# Patient Record
Sex: Female | Born: 1990 | Race: Black or African American | Hispanic: Yes | Marital: Single | Smoking: Never smoker
Health system: Southern US, Community
[De-identification: ages and names within clinical notes are randomized; demographics above are authoritative.]

## PROBLEM LIST (undated history)

## (undated) ENCOUNTER — Inpatient Hospital Stay (HOSPITAL_COMMUNITY): Payer: Self-pay

## (undated) DIAGNOSIS — T8859XA Other complications of anesthesia, initial encounter: Secondary | ICD-10-CM

## (undated) DIAGNOSIS — Z9889 Other specified postprocedural states: Secondary | ICD-10-CM

## (undated) DIAGNOSIS — Z789 Other specified health status: Secondary | ICD-10-CM

## (undated) DIAGNOSIS — T4145XA Adverse effect of unspecified anesthetic, initial encounter: Secondary | ICD-10-CM

## (undated) DIAGNOSIS — R112 Nausea with vomiting, unspecified: Secondary | ICD-10-CM

## (undated) HISTORY — DX: Adverse effect of unspecified anesthetic, initial encounter: T41.45XA

## (undated) HISTORY — DX: Other specified postprocedural states: Z98.890

## (undated) HISTORY — DX: Other complications of anesthesia, initial encounter: T88.59XA

## (undated) HISTORY — PX: DILATION AND CURETTAGE OF UTERUS: SHX78

## (undated) HISTORY — DX: Other specified health status: Z78.9

## (undated) HISTORY — DX: Nausea with vomiting, unspecified: R11.2

---
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
## 2010-01-13 ENCOUNTER — Emergency Department: Payer: Self-pay | Admitting: Emergency Medicine

## 2011-02-05 ENCOUNTER — Ambulatory Visit: Payer: Self-pay | Admitting: Obstetrics and Gynecology

## 2011-02-05 LAB — CBC WITH DIFFERENTIAL/PLATELET
Basophil #: 0 10*3/uL (ref 0.0–0.1)
Basophil %: 0.3 %
Eosinophil %: 1.3 %
HCT: 35.7 % (ref 35.0–47.0)
HGB: 12.2 g/dL (ref 12.0–16.0)
Lymphocyte #: 1.3 10*3/uL (ref 1.0–3.6)
Lymphocyte %: 18.5 %
MCHC: 34.2 g/dL (ref 32.0–36.0)
MCV: 89 fL (ref 80–100)
Monocyte #: 0.5 10*3/uL (ref 0.0–0.7)
Monocyte %: 6.8 %
Neutrophil #: 5.2 10*3/uL (ref 1.4–6.5)
RBC: 3.99 10*6/uL (ref 3.80–5.20)
WBC: 7.1 10*3/uL (ref 3.6–11.0)

## 2011-02-06 ENCOUNTER — Inpatient Hospital Stay: Payer: Self-pay | Admitting: Obstetrics and Gynecology

## 2011-02-07 LAB — HEMATOCRIT: HCT: 33.8 % — ABNORMAL LOW (ref 35.0–47.0)

## 2014-04-03 ENCOUNTER — Ambulatory Visit (INDEPENDENT_AMBULATORY_CARE_PROVIDER_SITE_OTHER): Payer: BLUE CROSS/BLUE SHIELD | Admitting: Emergency Medicine

## 2014-04-03 ENCOUNTER — Ambulatory Visit (HOSPITAL_COMMUNITY)
Admission: RE | Admit: 2014-04-03 | Discharge: 2014-04-03 | Disposition: A | Discharge status: Home / Self Care | Payer: BLUE CROSS/BLUE SHIELD | Source: Ambulatory Visit | Attending: Emergency Medicine | Admitting: Emergency Medicine

## 2014-04-03 VITALS — BP 104/60 | HR 61 | Temp 98.1°F | Resp 16 | Ht 62.0 in | Wt 134.2 lb

## 2014-04-03 DIAGNOSIS — R110 Nausea: Secondary | ICD-10-CM | POA: Insufficient documentation

## 2014-04-03 DIAGNOSIS — R519 Headache, unspecified: Secondary | ICD-10-CM

## 2014-04-03 DIAGNOSIS — R51 Headache: Secondary | ICD-10-CM | POA: Diagnosis present

## 2014-04-03 DIAGNOSIS — R42 Dizziness and giddiness: Secondary | ICD-10-CM | POA: Diagnosis not present

## 2014-04-03 MED ORDER — BUTALBITAL-APAP-CAFFEINE 50-325-40 MG PO TABS: 1.0000 | ORAL_TABLET | Freq: Four times a day (QID) | ORAL | Status: DC | PRN

## 2014-04-03 NOTE — Progress Notes (Signed)
[facility] [address], [place] [address] [phone]  Date:  04/03/2014   Name:  Jenny Randolph   DOB:  12/04/1990   MRN:  [id number]  PCP:  No PCP Per Patient    Chief Complaint: Migraine and Nausea   History of Present Illness:  Jenny Randolph is a 24 y.o. very pleasant female patient who presents with the following:  Ill with headache on right side for two weeks. Says she has NO history of migraine or sever headaches No antecedent illness or head injury No fever or chills Says she awakens with headache and goes to sleep with headaches.  Goes away for two hours with ibuprofen Says in pain free interval she is dizzy and has blurred vision in right eye. No slurred speech, difficulty expressing thoughts.  No weakness.   No difficulty walking or with balance No improvement with over the counter medications or other home remedies.  Denies other complaint or health concern today.  Works as a Child psychotherapistwaitress .  There are no active problems to display for this patient.   History reviewed. No pertinent past medical history.  Past Surgical History  Procedure Laterality Date  . Cesarean section      History  Substance Use Topics  . Smoking status: Never Smoker   . Smokeless tobacco: Not on file  . Alcohol Use: Not on file    Family History  Problem Relation Age of Onset  . Diabetes Mother   . Stroke Father   . Diabetes Maternal Grandmother   . Hyperlipidemia Maternal Grandmother   . Diabetes Maternal Grandfather     Allergies  Allergen Reactions  . Penicillins Hives    Medication list has been reviewed and updated.  No current outpatient prescriptions on file prior to visit.   No current facility-administered medications on file prior to visit.    Review of Systems:  As per HPI, otherwise negative.    Physical Examination: Filed Vitals:   04/03/14 1023  BP: 104/60  Pulse: 61  Temp: 98.1 F (36.7 C)  Resp: 16   Filed  Vitals:   04/03/14 1023  Height: 5\' 2"  (1.575 m)  Weight: 134 lb 3.2 oz (60.873 kg)   Body mass index is 24.54 kg/(m^2). Ideal Body Weight: Weight in (lb) to have BMI = 25: 136.4  GEN: WDWN, NAD, Non-toxic, A & O x 3 HEENT: Atraumatic, Normocephalic. Neck supple. No masses, No LAD. Ears and Nose: No external deformity. CV: RRR, No M/G/R. No JVD. No thrill. No extra heart sounds. PULM: CTA B, no wheezes, crackles, rhonchi. No retractions. No resp. distress. No accessory muscle use. ABD: S, NT, ND, +BS. No rebound. No HSM. EXTR: No c/c/e NEURO Normal gait.   Romberg, tandem gait, toe and heel walking intact PRRERLA EOMI CN 2-12 intact.  Motor intact PSYCH: Normally interactive. Conversant. Not depressed or anxious appearing.  Calm demeanor.    Assessment and Plan: Worst headache of life MR  Signed,  Phillips OdorJeffery Delon Revelo, MD

## 2014-04-03 NOTE — Patient Instructions (Signed)

## 2014-04-29 ENCOUNTER — Emergency Department (HOSPITAL_COMMUNITY)
Admission: EM | Admit: 2014-04-29 | Discharge: 2014-04-30 | Disposition: A | Discharge status: Home / Self Care | Payer: BLUE CROSS/BLUE SHIELD | Attending: Emergency Medicine | Admitting: Emergency Medicine

## 2014-04-29 ENCOUNTER — Encounter (HOSPITAL_COMMUNITY): Payer: Self-pay | Admitting: Emergency Medicine

## 2014-04-29 DIAGNOSIS — J111 Influenza due to unidentified influenza virus with other respiratory manifestations: Secondary | ICD-10-CM | POA: Insufficient documentation

## 2014-04-29 DIAGNOSIS — G8929 Other chronic pain: Secondary | ICD-10-CM | POA: Diagnosis not present

## 2014-04-29 DIAGNOSIS — Z880 Allergy status to penicillin: Secondary | ICD-10-CM | POA: Diagnosis not present

## 2014-04-29 DIAGNOSIS — H9209 Otalgia, unspecified ear: Secondary | ICD-10-CM | POA: Diagnosis not present

## 2014-04-29 DIAGNOSIS — R509 Fever, unspecified: Secondary | ICD-10-CM | POA: Diagnosis present

## 2014-04-29 MED ORDER — ACETAMINOPHEN 325 MG PO TABS
650.0000 mg | ORAL_TABLET | Freq: Four times a day (QID) | ORAL | Status: DC | PRN
  Administered 2014-04-29: 650 mg via ORAL
  Filled 2014-04-29: qty 2

## 2014-04-29 MED ORDER — OSELTAMIVIR PHOSPHATE 75 MG PO CAPS
75.0000 mg | ORAL_CAPSULE | ORAL | Status: AC
  Administered 2014-04-29: 75 mg via ORAL
  Filled 2014-04-29: qty 1

## 2014-04-29 NOTE — ED Provider Notes (Signed)
CSN: [id number]     Arrival date & time 04/29/14  2228 History  This chart was scribed for non-physician practitioner Earley Favor, PA-C working with Tilden Fossa, MD by Annye Asa, ED Scribe. This patient was seen in room WTR8/WTR8 and the patient's care was started at 11:12 PM.   Chief Complaint  Patient presents with  . Fever  . Sore Throat  . Otalgia   Patient is a 24 y.o. female presenting with fever, pharyngitis, and ear pain. The history is provided by the patient. No language interpreter was used.  Fever Associated symptoms: cough, ear pain, myalgias and sore throat   Sore Throat  Otalgia Associated symptoms: cough, fever and sore throat     HPI Comments: Jenny Randolph is a 24 y.o. female who presents to the Emergency Department complaining of 2 days of fever, generalized myalgias, sore throat, bilateral otalgia, and cough. Patient reports she has taken some "headache medication" she was prescribed previously for chronic headaches; she has been trying to "sweat out" the fever. No other treatments or medications tried PTA.   No PCP at this time.   History reviewed. No pertinent past medical history. Past Surgical History  Procedure Laterality Date  . Cesarean section     Family History  Problem Relation Age of Onset  . Diabetes Mother   . Stroke Father   . Diabetes Maternal Grandmother   . Hyperlipidemia Maternal Grandmother   . Diabetes Maternal Grandfather    History  Substance Use Topics  . Smoking status: Never Smoker   . Smokeless tobacco: Not on file  . Alcohol Use: No   OB History    No data available     Review of Systems  Constitutional: Positive for fever.  HENT: Positive for ear pain and sore throat.   Respiratory: Positive for cough.   Musculoskeletal: Positive for myalgias.   Allergies  Penicillins  Home Medications   Prior to Admission medications   Medication Sig Start Date End Date Taking? Authorizing Provider   butalbital-acetaminophen-caffeine (FIORICET) 50-325-40 MG per tablet Take 1-2 tablets by mouth every 6 (six) hours as needed for headache. 04/03/14 04/03/15 Yes Carmelina Dane, MD  ibuprofen (ADVIL,MOTRIN) 800 MG tablet Take 800 mg by mouth every 8 (eight) hours as needed.    Historical Provider, MD  oseltamivir (TAMIFLU) 75 MG capsule Take 1 capsule (75 mg total) by mouth 2 (two) times daily. 04/30/14   Earley Favor, NP   BP 122/82 mmHg  Pulse 108  Temp(Src) 99.2 F (37.3 C) (Oral)  Resp 18  Ht  (1.549 m)  Wt 134 lb (60.782 kg)  BMI 25.33 kg/m2  SpO2 96%  LMP 03/19/2014 Physical Exam  Constitutional: She is oriented to person, place, and time. She appears well-developed and well-nourished.  HENT:  Head: Normocephalic and atraumatic.  Right Ear: External ear normal.  Left Ear: External ear normal.  Mouth/Throat: Oropharynx is clear and moist. No oropharyngeal exudate.  Neck: No tracheal deviation present.  Cardiovascular: Normal rate, regular rhythm and normal heart sounds.   Pulmonary/Chest: Effort normal and breath sounds normal. No respiratory distress. She has no wheezes. She has no rales.  Neurological: She is alert and oriented to person, place, and time.  Skin: Skin is warm and dry.  Psychiatric: She has a normal mood and affect. Her behavior is normal.  Nursing note and vitals reviewed.   ED Course  Procedures   DIAGNOSTIC STUDIES: Oxygen Saturation is 96% on RA, adequate by my  interpretation.    COORDINATION OF CARE: 11:14 PM Discussed treatment plan with pt at bedside and pt agreed to plan.   Labs Review Labs Reviewed - No data to display  Imaging Review No results found.   EKG Interpretation None      MDM   Final diagnoses:  Influenza    I personally performed the services described in this documentation, which was scribed in my presence. The recorded information has been reviewed and is accurate.     Earley FavorGail [place], NP 04/30/14  0018  Tilden FossaElizabeth Rees, MD 04/30/14 [phone]

## 2014-04-29 NOTE — ED Notes (Signed)
Pt reports yesterday started having fever, sore throat, and bilateral otalgia. Denies taking any medications for fever, states she did take some "headache medication" that she was prescribed for chronic headaches.

## 2014-04-30 MED ORDER — OSELTAMIVIR PHOSPHATE 75 MG PO CAPS: 75.0000 mg | ORAL_CAPSULE | Freq: Two times a day (BID) | ORAL | Status: DC

## 2014-04-30 NOTE — Discharge Instructions (Signed)
Cough, Adult  A cough is a reflex. It helps you clear your throat and airways. A cough can help heal your body. A cough can last 2 or 3 weeks (acute) or may last more than 8 weeks (chronic). Some common causes of a cough can include an infection, allergy, or a cold. HOME CARE  Only take medicine as told by your doctor.  If given, take your medicines (antibiotics) as told. Finish them even if you start to feel better.  Use a cold steam vaporizer or humidifier in your home. This can help loosen thick spit (secretions).  Sleep so you are almost sitting up (semi-upright). Use pillows to do this. This helps reduce coughing.  Rest as needed.  Stop smoking if you smoke. GET HELP RIGHT AWAY IF:  You have yellowish-white fluid (pus) in your thick spit.  Your cough gets worse.  Your medicine does not reduce coughing, and you are losing sleep.  You cough up blood.  You have trouble breathing.  Your pain gets worse and medicine does not help.  You have a fever. MAKE SURE YOU:   Understand these instructions.  Will watch your condition.  Will get help right away if you are not doing well or get worse. Document Released: 09/27/2010 Document Revised: 05/31/2013 Document Reviewed: 09/27/2010 [facility] Patient Information 2015 [employer] [place]. This information is not intended to replace advice given to you by your health care provider. Make sure you discuss any questions you have with your health care provider. You can safely take over-the-counter Delsym for any cough.  He can take Tylenol and ibuprofen alternating doses every 4 hours for fever and body aches.  Please try to get rest, drink plenty of fluids

## 2014-04-30 NOTE — ED Notes (Signed)
Mom reports pt started to have a fever last night and states that pt was c/o ear pain.

## 2014-05-22 NOTE — Op Note (Signed)
PATIENT NAME:  Jenny Randolph, [name] MR#:  [id number] DATE OF BIRTH:  02-10-1990  DATE OF PROCEDURE:  02/06/2011  PREOPERATIVE DIAGNOSES:  1. History of prior Cesarean section, desires repeat. 2. Intrauterine pregnancy at 39 weeks, 3 days.   POSTOPERATIVE DIAGNOSES: 1. History of prior Cesarean section, desires repeat. 2. Intrauterine pregnancy at 39 weeks, 3 days.   OPERATION PERFORMED:  1. Repeat low transverse Cesarean section. 2. Scar revision.   ANESTHESIA USED: Spinal.   PRIMARY SURGEON: Florina OuAndreas M. Bonney AidStaebler, MD   ASSISTANT: Shella Maximarcia Putnam, Certified Nurse Midwife     ESTIMATED BLOOD LOSS: 500 mL.  OPERATIVE FLUIDS: 1300 mL of Crystalloid; 250 mL of clear urine.   COMPLICATIONS: None.   DRAINS OR TUBES: Foley to gravity drainage and ON-Q catheter system.   INTRAOPERATIVE FINDINGS: Normal tubes, ovaries, and uterus, liveborn female infant weighing 2680 grams, Apgars 9 and 9.   COMPLICATIONS: None.   SPECIMENS: None.   PATIENT CONDITION FOLLOWING PROCEDURE: Stable.   PROCEDURE IN DETAIL: Risks, benefits, and alternatives of the procedure were discussed with the patient prior to proceeding to the operating room. The patient was placed under spinal anesthesia and she was prepped and draped in the supine position in the usual sterile fashion. An elliptical incision was made around the patient's pre-existing to remove keloid that had formed from her previous Cesarean section incision. Incision was carried down sharply to the level of the rectus fascia using the knife. The fascia was excised with the knife and then the fascial incision was extended using Mayo scissors. The superior edges of the rectus fascia were grasped with two Kocher clamps and the underlying rectus was bluntly dissected off the fascial sheath. The median raphe was then incised using Mayo scissors. The inferior rectus fascia was likewise separated off the rectus muscle. The midline was identified and separated using  a hemostat. Upon separating the midline rectus muscle with a hemostat, the peritoneum was entered. The peritoneal incision was then extended using Metzenbaum scissors and manual traction. A bladder blade was placed. Bladder flap was then created and the bladder blade was replaced. Hysterotomy incision was made and the uterus was entered bluntly using the operator's finger. The infant was noted to be in the OA position. The vertex was grasped, flexed, brought to hysterotomy incision and infant was delivered atraumatically. The infant's cord was cut and clamped and infant was suctioned and passed to the awaiting pediatricians. Cord gas was obtained at this time. The placenta was delivered using manual extraction. The uterus was exteriorized and wiped clean of clots and debris. The edges of the hysterotomy incision were tagged with Allis clamps and the hysterotomy incision was then repaired in a two layered closure with the first closure being 0 Vicryl in a running locked fashion and the second layer a 0 Vicryl horizontal imbricating. Following closure of the hysterotomy incision, the abdomen was irrigated and the uterus was replaced into the abdomen. The hysterotomy incision was inspected one last time and noted to be hemostatic. The peritoneum was then closed using a 2-0 Vicryl in a running fashion. Next, an ON-Q catheter system was placed by tenting the superior border of the rectus fascia up and then placing the trocars to guide the catheter subfascially. Each catheter was flushed with 5 mL of 0.5% Sensorcaine. The rectus fascia was then closed using a #1 loop PDS in a running fashion. The subcutaneous tissue was irrigated and hemostasis was achieved using the Bovie. The skin was closed using a 4-0  Monocryl in a subcuticular fashion.   ____________________________ Florina Ou. Bonney Aid, MD ams:drc D: 02/08/2011 18:57:04 ET T: 02/09/2011 09:55:38 ET JOB#: [id number]  cc: Florina Ou. Bonney Aid, MD,  <Dictator> Lorrene Reid MD ELECTRONICALLY SIGNED 02/20/2011 9:03

## 2014-06-21 ENCOUNTER — Other Ambulatory Visit: Payer: Self-pay | Admitting: Certified Nurse Midwife

## 2014-06-21 ENCOUNTER — Encounter: Payer: Self-pay | Admitting: Certified Nurse Midwife

## 2014-06-21 ENCOUNTER — Ambulatory Visit (INDEPENDENT_AMBULATORY_CARE_PROVIDER_SITE_OTHER): Payer: BLUE CROSS/BLUE SHIELD | Admitting: Certified Nurse Midwife

## 2014-06-21 VITALS — BP 107/76 | HR 81 | Temp 98.3°F | Ht 61.0 in | Wt 132.0 lb

## 2014-06-21 DIAGNOSIS — N63 Unspecified lump in unspecified breast: Secondary | ICD-10-CM

## 2014-06-21 DIAGNOSIS — N6310 Unspecified lump in the right breast, unspecified quadrant: Secondary | ICD-10-CM

## 2014-06-21 DIAGNOSIS — Z01419 Encounter for gynecological examination (general) (routine) without abnormal findings: Secondary | ICD-10-CM

## 2014-06-21 DIAGNOSIS — Z3009 Encounter for other general counseling and advice on contraception: Secondary | ICD-10-CM

## 2014-06-21 DIAGNOSIS — Z309 Encounter for contraceptive management, unspecified: Secondary | ICD-10-CM | POA: Diagnosis not present

## 2014-06-21 DIAGNOSIS — N6320 Unspecified lump in the left breast, unspecified quadrant: Secondary | ICD-10-CM

## 2014-06-21 DIAGNOSIS — Z3201 Encounter for pregnancy test, result positive: Secondary | ICD-10-CM

## 2014-06-21 LAB — CBC
HEMATOCRIT: 41.6 % (ref 36.0–46.0)
Hemoglobin: 13.6 g/dL (ref 12.0–15.0)
MCH: 29.2 pg (ref 26.0–34.0)
MCHC: 32.7 g/dL (ref 30.0–36.0)
MCV: 89.3 fL (ref 78.0–100.0)
MPV: 10.7 fL (ref 8.6–12.4)
PLATELETS: 229 10*3/uL (ref 150–400)
RBC: 4.66 MIL/uL (ref 3.87–5.11)
RDW: 13.9 % (ref 11.5–15.5)
WBC: 4.3 10*3/uL (ref 4.0–10.5)

## 2014-06-21 MED ORDER — NORELGESTROMIN-ETH ESTRADIOL 150-35 MCG/24HR TD PTWK: 1.0000 | MEDICATED_PATCH | TRANSDERMAL | Status: DC

## 2014-06-21 NOTE — Addendum Note (Signed)
Addended by: Marya LandryFOSTER, Gordon Vandunk D on: 06/21/2014 02:39 PM   Modules accepted: Orders

## 2014-06-21 NOTE — Progress Notes (Signed)
Patient ID: Jenny Randolph, female   DOB: 10/24/1990, 24 y.o.   MRN: [id number]    Subjective:      Jenny Randolph is a 24 y.o. female here for a routine exam.  Current complaints: none, desires STD screening.  Has tried Mirena, Depo and Paediatric nurse, desires contraception. Is sexually active.  Mirena, had no appetite, Depo gained weight and did not care for the feel of Nuva Ring.  Father has had 2 CVAs.  In school and working.  Reports positive at home pregnancy tests, in office test was neg, f/u with blood testing. Declines dermatology referral at the present time.    Personal health questionnaire:  Is patient Ashkenazi Jewish, have a family history of breast and/or ovarian cancer: yes Is there a family history of uterine cancer diagnosed at age < 73, gastrointestinal cancer, urinary tract cancer, family member who is a Personnel officer syndrome-associated carrier: yes Is the patient overweight and hypertensive, family history of diabetes, personal history of gestational diabetes, preeclampsia or PCOS: yes Is patient over 11, have PCOS,  family history of premature CHD under age 35, diabetes, smoke, have hypertension or peripheral artery disease:  yes At any time, has a partner hit, kicked or otherwise hurt or frightened you?: no Over the past 2 weeks, have you felt down, depressed or hopeless?: no Over the past 2 weeks, have you felt little interest or pleasure in doing things?:no   Gynecologic History Patient's last menstrual period was 05/29/2014. Contraception: none Last Pap: unknown. Results were: normal according to the patient  Last mammogram: N/A. Never had any breast issues  Obstetric History OB History  Gravida Para Term Preterm AB SAB TAB Ectopic Multiple Living  # Outcome Date GA Lbr Len/2nd Weight Sex Delivery Anes PTL Lv  3 Gravida 2013    F CS-LTranv Spinal  Y  2 Gravida 2010    F CS-LTranv EPI  Y  1 SAB 2009             Comments: Dilation and curretage       History reviewed. No pertinent past medical history.  Past Surgical History  Procedure Laterality Date  . Cesarean section       Current outpatient prescriptions:  .  butalbital-acetaminophen-caffeine (FIORICET) 50-325-40 MG per tablet, Take 1-2 tablets by mouth every 6 (six) hours as needed for headache. (Patient not taking: Reported on 06/21/2014), Disp: 40 tablet, Rfl: 1 .  norelgestromin-ethinyl estradiol (ORTHO EVRA) 150-35 MCG/24HR transdermal patch, Place 1 patch onto the skin once a week., Disp: 3 patch, Rfl: 12 Allergies  Allergen Reactions  . Penicillins Hives    History  Substance Use Topics  . Smoking status: Never Smoker   . Smokeless tobacco: Not on file  . Alcohol Use: No    Family History  Problem Relation Age of Onset  . Diabetes Mother   . Stroke Father   . Diabetes Maternal Grandmother   . Hyperlipidemia Maternal Grandmother   . Diabetes Maternal Grandfather       Review of Systems  Constitutional: negative for fatigue and weight loss Respiratory: negative for cough and wheezing Cardiovascular: negative for chest pain, fatigue and palpitations Gastrointestinal: negative for abdominal pain and change in bowel habits Musculoskeletal:negative for myalgias Neurological: negative for gait problems and tremors Behavioral/Psych: negative for abusive relationship, depression Endocrine: negative for temperature intolerance   Genitourinary:negative for abnormal menstrual periods, genital lesions, hot flashes, sexual  problems and vaginal discharge Integument/breast: negative for breast lump, breast tenderness, nipple discharge and skin lesion(s).  Does have recent acne break out.     Objective:       BP 107/76 mmHg  Pulse 81  Temp(Src) 98.3 F (36.8 C)  Ht 5\' 1"  (1.549 m)  Wt 132 lb (59.875 kg)  BMI 24.95 kg/m2  LMP 05/29/2014 General:   alert  Skin:   no rash or abnormalities  Lungs:   clear to auscultation bilaterally  Heart:   regular rate and  rhythm, S1, S2 normal, no murmur, click, rub or gallop  Breasts:   normal skin and no nipple changes or axillary nodes.  2  suspicious masses: one on right side under nipple towards sternum above breast bone the other on the left side towards sternum lateral to the nipple about 1 cm in size.   Abdomen:  normal findings: no organomegaly, soft, non-tender and no hernia  Pelvis:  External genitalia: normal general appearance Urinary system: urethral meatus normal and bladder without fullness, nontender Vaginal: normal without tenderness, induration or masses Cervix: normal appearance Adnexa: normal bimanual exam Uterus: anteverted and non-tender, normal size   Lab Review Urine pregnancy test Labs reviewed yes Radiologic studies reviewed no  50% of 30 min visit spent on counseling and coordination of care.   Assessment:    Healthy female exam.   Suspicious breast lesions ?at home positive pregnancy test Contraceptive counseling STD screening   Plan:    Education reviewed: calcium supplements, depression evaluation, low fat, low cholesterol diet, safe sex/STD prevention, self breast exams, skin cancer screening and weight bearing exercise. Contraception: Ortho-Evra patches weekly. Follow up in: 1 year.   Meds ordered this encounter  Medications  . norelgestromin-ethinyl estradiol (ORTHO EVRA) 150-35 MCG/24HR transdermal patch    Sig: Place 1 patch onto the skin once a week.    Dispense:  3 patch    Refill:  12   Orders Placed This Encounter  Procedures  . US BREAST LTD UNI LEFT INC AXILLA    06/21/14-co sign requested/gmd suspicious breast masses bilateral/POSITION IS 6 O'CLOCK/NO HX OF BREAST CA/NO IMPLANTS PF NONE/NO NEEDS/GMD/PT/[employer]/[facility] [phone] [id number]/GMD    Standing Status: Future     Number of Occurrences:      Standing Expiration Date: 08/21/2015    Order Specific Question:  Reason for Exam (SYMPTOM  OR DIAGNOSIS REQUIRED)    Answer:  suspicious breast masses  bilateral/POSITION IS 6 O'CLOCK    Order Specific Question:  Preferred imaging location?    [facility]  . HIV antibody (with reflex)  . Hepatitis B surface antigen  . RPR  . Hepatitis C antibody  . TSH  . CBC  . Prolactin  . Testosterone, Free, Total, SHBG  . 17-Hydroxyprogesterone  . Progesterone  . hCG, quantitative, pregnancy  . POCT urine pregnancy

## 2014-06-22 ENCOUNTER — Ambulatory Visit
Admission: RE | Admit: 2014-06-22 | Discharge: 2014-06-22 | Disposition: A | Discharge status: Home / Self Care | Payer: BLUE CROSS/BLUE SHIELD | Source: Ambulatory Visit | Attending: Certified Nurse Midwife | Admitting: Certified Nurse Midwife

## 2014-06-22 ENCOUNTER — Telehealth: Payer: Self-pay | Admitting: *Deleted

## 2014-06-22 DIAGNOSIS — N63 Unspecified lump in unspecified breast: Secondary | ICD-10-CM

## 2014-06-22 LAB — RPR

## 2014-06-22 LAB — PAP IG W/ RFLX HPV ASCU

## 2014-06-22 LAB — PROGESTERONE: Progesterone: 9.9 ng/mL

## 2014-06-22 LAB — HIV ANTIBODY (ROUTINE TESTING W REFLEX): HIV 1&2 Ab, 4th Generation: NONREACTIVE

## 2014-06-22 LAB — TESTOSTERONE, FREE, TOTAL, SHBG
Sex Hormone Binding: 57 nmol/L (ref 17–124)
Testosterone, Free: 4.1 pg/mL (ref 0.6–6.8)
Testosterone-% Free: 1.3 % (ref 0.4–2.4)
Testosterone: 33 ng/dL (ref 10–70)

## 2014-06-22 LAB — HCG, QUANTITATIVE, PREGNANCY

## 2014-06-22 LAB — HEPATITIS B SURFACE ANTIGEN: HEP B S AG: NEGATIVE

## 2014-06-22 LAB — PROLACTIN: PROLACTIN: 12.3 ng/mL

## 2014-06-22 LAB — HEPATITIS C ANTIBODY: HCV Ab: NEGATIVE

## 2014-06-22 LAB — TSH: TSH: 1.309 u[IU]/mL (ref 0.350–4.500)

## 2014-06-22 NOTE — Telephone Encounter (Signed)
Pt called to office for lab results.   Pt made aware of results including pregnancy results.  Pt made aware that quant is normal level, non pregnancy. Pt has no other concerns.

## 2014-06-23 LAB — SURESWAB, VAGINOSIS/VAGINITIS PLUS
Atopobium vaginae: NOT DETECTED Log (cells/mL)
C. ALBICANS, DNA: NOT DETECTED
C. GLABRATA, DNA: NOT DETECTED
C. PARAPSILOSIS, DNA: NOT DETECTED
C. TRACHOMATIS RNA, TMA: NOT DETECTED
C. tropicalis, DNA: NOT DETECTED
LACTOBACILLUS SPECIES: 7.6 Log (cells/mL)
MEGASPHAERA SPECIES: NOT DETECTED Log (cells/mL)
N. gonorrhoeae RNA, TMA: NOT DETECTED
T. VAGINALIS RNA, QL TMA: NOT DETECTED

## 2014-06-24 LAB — 17-HYDROXYPROGESTERONE: 17-OH-Progesterone, LC/MS/MS: 219 ng/dL

## 2014-08-08 ENCOUNTER — Encounter: Payer: Self-pay | Admitting: Obstetrics

## 2014-08-08 ENCOUNTER — Ambulatory Visit (INDEPENDENT_AMBULATORY_CARE_PROVIDER_SITE_OTHER): Payer: BLUE CROSS/BLUE SHIELD | Admitting: Obstetrics

## 2014-08-08 VITALS — BP 104/74 | HR 87 | Temp 98.5°F | Ht 61.0 in | Wt 130.0 lb

## 2014-08-08 DIAGNOSIS — N898 Other specified noninflammatory disorders of vagina: Secondary | ICD-10-CM | POA: Diagnosis not present

## 2014-08-08 DIAGNOSIS — R399 Unspecified symptoms and signs involving the genitourinary system: Secondary | ICD-10-CM

## 2014-08-08 LAB — POCT URINALYSIS DIPSTICK
BILIRUBIN UA: NEGATIVE
Glucose, UA: NEGATIVE
Ketones, UA: NEGATIVE
Nitrite, UA: NEGATIVE
Spec Grav, UA: 1.02
UROBILINOGEN UA: NEGATIVE
pH, UA: 5

## 2014-08-08 MED ORDER — NITROFURANTOIN MONOHYD MACRO 100 MG PO CAPS: 100.0000 mg | ORAL_CAPSULE | Freq: Two times a day (BID) | ORAL | Status: DC

## 2014-08-08 NOTE — Progress Notes (Signed)
Patient ID: Jenny Randolph, female   DOB: 11/23/1990, 24 y.o.   MRN: [id number]  Chief Complaint  Patient presents with  . Urinary Tract Infection    HPI Jenny Randolph is a 24 y.o. female.  Burning and pain with urination.  Vaginal discharge but no odor or itching.  Denies fever / chills, N/V or diarrhea.  HPI  History reviewed. No pertinent past medical history.  Past Surgical History  Procedure Laterality Date  . Cesarean section      Family History  Problem Relation Age of Onset  . Diabetes Mother   . Stroke Father   . Diabetes Maternal Grandmother   . Hyperlipidemia Maternal Grandmother   . Diabetes Maternal Grandfather     Social History History  Substance Use Topics  . Smoking status: Never Smoker   . Smokeless tobacco: Not on file  . Alcohol Use: No    Allergies  Allergen Reactions  . Penicillins Hives    Current Outpatient Prescriptions  Medication Sig Dispense Refill  . norelgestromin-ethinyl estradiol (ORTHO EVRA) 150-35 MCG/24HR transdermal patch Place 1 patch onto the skin once a week. 3 patch 12  . butalbital-acetaminophen-caffeine (FIORICET) 50-325-40 MG per tablet Take 1-2 tablets by mouth every 6 (six) hours as needed for headache. (Patient not taking: Reported on 06/21/2014) 40 tablet 1  . nitrofurantoin, macrocrystal-monohydrate, (MACROBID) 100 MG capsule Take 1 capsule (100 mg total) by mouth 2 (two) times daily. 1 po BID x 7days 14 capsule 2   No current facility-administered medications for this visit.    Review of Systems Review of Systems Constitutional: negative for fatigue and weight loss Respiratory: negative for cough and wheezing Cardiovascular: negative for chest pain, fatigue and palpitations Gastrointestinal: negative for abdominal pain and change in bowel habits Genitourinary: positive for vaginal discharge Integument/breast: negative for nipple discharge Musculoskeletal:negative for myalgias Neurological: negative for gait  problems and tremors Behavioral/Psych: negative for abusive relationship, depression Endocrine: negative for temperature intolerance     Blood pressure 104/74, pulse 87, temperature 98.5 F (36.9 C), height 5\' 1"  (1.549 m), weight 130 lb (58.968 kg), last menstrual period 07/31/2014.  Physical Exam Physical Exam:          General:  Alert and no distress. Abdomen:  normal findings: no organomegaly, soft, non-tender and no hernia  Pelvis:  External genitalia: normal general appearance Urinary system: urethral meatus normal and bladder without fullness, nontender Vaginal: normal without tenderness, induration or masses Cervix: normal appearance Adnexa: normal bimanual exam Uterus: anteverted and non-tender, normal size      Data Reviewed Labs  Assessment     UTI symptoms. Vaginal discharge.     Plan    Macrobid Rx Wet prep and cultures sent.  Will base Rx on results. F/U prn  Orders Placed This Encounter  Procedures  . Urine culture  . SureSwab, Vaginosis/Vaginitis Plus  . POCT urinalysis dipstick   Meds ordered this encounter  Medications  . nitrofurantoin, macrocrystal-monohydrate, (MACROBID) 100 MG capsule    Sig: Take 1 capsule (100 mg total) by mouth 2 (two) times daily. 1 po BID x 7days    Dispense:  14 capsule    Refill:  2

## 2014-08-09 ENCOUNTER — Encounter: Payer: Self-pay | Admitting: Obstetrics

## 2014-08-09 LAB — URINE CULTURE: Colony Count: 5000

## 2014-08-11 LAB — SURESWAB, VAGINOSIS/VAGINITIS PLUS
Atopobium vaginae: NOT DETECTED Log (cells/mL)
C. ALBICANS, DNA: NOT DETECTED
C. GLABRATA, DNA: NOT DETECTED
C. TRACHOMATIS RNA, TMA: NOT DETECTED
C. parapsilosis, DNA: NOT DETECTED
C. tropicalis, DNA: NOT DETECTED
GARDNERELLA VAGINALIS: 4.8 Log (cells/mL)
LACTOBACILLUS SPECIES: 7.4 Log (cells/mL)
MEGASPHAERA SPECIES: NOT DETECTED Log (cells/mL)
N. gonorrhoeae RNA, TMA: NOT DETECTED
T. vaginalis RNA, QL TMA: NOT DETECTED

## 2014-10-04 ENCOUNTER — Encounter: Payer: Self-pay | Admitting: Obstetrics

## 2014-10-04 ENCOUNTER — Ambulatory Visit (INDEPENDENT_AMBULATORY_CARE_PROVIDER_SITE_OTHER): Payer: BLUE CROSS/BLUE SHIELD | Admitting: *Deleted

## 2014-10-04 VITALS — BP 110/77 | HR 67 | Temp 98.2°F | Ht 61.0 in | Wt 133.0 lb

## 2014-10-04 DIAGNOSIS — N926 Irregular menstruation, unspecified: Secondary | ICD-10-CM | POA: Diagnosis not present

## 2014-10-04 LAB — POCT URINE PREGNANCY: Preg Test, Ur: POSITIVE — AB

## 2014-10-04 NOTE — Progress Notes (Signed)
Patient in office today for pregnancy test. Patient states she had a positive home pregnancy test. Pregnancy Test in office is positive. Patient states this is an unplanned pregnancy that she does wish to continue. Patient encouraged to start prenatal vitamins. Patient encouraged to schedule NOB appointment.  BP 110/77 mmHg  Pulse 67  Temp(Src) 98.2 F (36.8 C)  Ht  (1.549 m)  Wt 133 lb (60.328 kg)  BMI 25.14 kg/m2  LMP 09/29/2014 (Exact Date)

## 2014-10-11 ENCOUNTER — Telehealth: Payer: Self-pay | Admitting: *Deleted

## 2014-10-11 NOTE — Telephone Encounter (Signed)
Call to patient- left message- patient had called with early bleeding and had not gone to hospital or called the office for appointment- LM on VM that I was calling to check on her regarding her bleeding.

## 2014-10-13 ENCOUNTER — Emergency Department (HOSPITAL_COMMUNITY): Payer: BLUE CROSS/BLUE SHIELD

## 2014-10-13 ENCOUNTER — Telehealth: Payer: Self-pay | Admitting: *Deleted

## 2014-10-13 ENCOUNTER — Encounter (HOSPITAL_COMMUNITY): Payer: Self-pay

## 2014-10-13 ENCOUNTER — Emergency Department (HOSPITAL_COMMUNITY)
Admission: EM | Admit: 2014-10-13 | Discharge: 2014-10-13 | Disposition: A | Discharge status: Home / Self Care | Payer: BLUE CROSS/BLUE SHIELD | Attending: Emergency Medicine | Admitting: Emergency Medicine

## 2014-10-13 DIAGNOSIS — O200 Threatened abortion: Secondary | ICD-10-CM | POA: Insufficient documentation

## 2014-10-13 DIAGNOSIS — Z79899 Other long term (current) drug therapy: Secondary | ICD-10-CM | POA: Insufficient documentation

## 2014-10-13 DIAGNOSIS — Z880 Allergy status to penicillin: Secondary | ICD-10-CM | POA: Diagnosis not present

## 2014-10-13 DIAGNOSIS — Z3A01 Less than 8 weeks gestation of pregnancy: Secondary | ICD-10-CM | POA: Diagnosis not present

## 2014-10-13 DIAGNOSIS — O26851 Spotting complicating pregnancy, first trimester: Secondary | ICD-10-CM

## 2014-10-13 DIAGNOSIS — R109 Unspecified abdominal pain: Secondary | ICD-10-CM

## 2014-10-13 DIAGNOSIS — O26899 Other specified pregnancy related conditions, unspecified trimester: Secondary | ICD-10-CM

## 2014-10-13 DIAGNOSIS — O209 Hemorrhage in early pregnancy, unspecified: Secondary | ICD-10-CM | POA: Diagnosis present

## 2014-10-13 DIAGNOSIS — N898 Other specified noninflammatory disorders of vagina: Secondary | ICD-10-CM

## 2014-10-13 LAB — CBC WITH DIFFERENTIAL/PLATELET
BASOS ABS: 0 10*3/uL (ref 0.0–0.1)
BASOS PCT: 0 %
Eosinophils Absolute: 0.3 10*3/uL (ref 0.0–0.7)
Eosinophils Relative: 3 %
HEMATOCRIT: 39.2 % (ref 36.0–46.0)
Hemoglobin: 13.1 g/dL (ref 12.0–15.0)
Lymphocytes Relative: 28 %
Lymphs Abs: 2.1 10*3/uL (ref 0.7–4.0)
MCH: 29.2 pg (ref 26.0–34.0)
MCHC: 33.4 g/dL (ref 30.0–36.0)
MCV: 87.3 fL (ref 78.0–100.0)
MONO ABS: 0.4 10*3/uL (ref 0.1–1.0)
Monocytes Relative: 5 %
NEUTROS ABS: 4.7 10*3/uL (ref 1.7–7.7)
Neutrophils Relative %: 64 %
PLATELETS: 208 10*3/uL (ref 150–400)
RBC: 4.49 MIL/uL (ref 3.87–5.11)
RDW: 13.5 % (ref 11.5–15.5)
WBC: 7.5 10*3/uL (ref 4.0–10.5)

## 2014-10-13 LAB — TYPE AND SCREEN
ABO/RH(D): O POS
Antibody Screen: NEGATIVE

## 2014-10-13 LAB — ABO/RH: ABO/RH(D): O POS

## 2014-10-13 LAB — WET PREP, GENITAL
Clue Cells Wet Prep HPF POC: NONE SEEN
TRICH WET PREP: NONE SEEN
Yeast Wet Prep HPF POC: NONE SEEN

## 2014-10-13 LAB — I-STAT BETA HCG BLOOD, ED (MC, WL, AP ONLY): I-stat hCG, quantitative: >2000 m[IU]/mL — ABNORMAL HIGH (ref <5)

## 2014-10-13 LAB — HCG, QUANTITATIVE, PREGNANCY: hCG, Beta Chain, Quant, S: 18120 m[IU]/mL — ABNORMAL HIGH (ref <5)

## 2014-10-13 NOTE — Discharge Instructions (Signed)

## 2014-10-13 NOTE — ED Notes (Signed)
Patient states that she had small amount of vaginal bleeding x 3 days. Patient states she called an OB-GYN and was told it was OK. Today. The patient noted increased bleeding that was dark in color. Patient also experienced abdominal cramping.

## 2014-10-13 NOTE — Telephone Encounter (Signed)
Patient called complaining of dark discharge and abdominal pian. 4:30 When pulling patient up- she is already at the hospital. She has not talked to anyone today.

## 2014-10-13 NOTE — ED Notes (Signed)
US at bedside

## 2014-10-13 NOTE — ED Notes (Signed)
Pt c/o light vaginal bleeding x 4days with bleeding increasing today.  [id number].

## 2014-10-14 ENCOUNTER — Ambulatory Visit: Payer: BLUE CROSS/BLUE SHIELD | Admitting: Obstetrics

## 2014-10-14 NOTE — ED Provider Notes (Signed)
CSN: [id number]     Arrival date & time 10/13/14  1519 History   First MD Initiated Contact with Patient 10/13/14 1721     Chief Complaint  Patient presents with  . Vaginal Bleeding  . 6 1/[redacted] weeks pregnant      (Consider location/radiation/quality/duration/timing/severity/associated sxs/prior Treatment) HPI Comments: 24 y.o. Female G3P1 presents for vaginal bleeding and lower abdominal cramping.  The patient believes herself to be 6.[redacted] weeks pregnant by dates with her last LMP 08/29/14.  The patient reports that she started spotting vaginally 4 days ago and that over the last 24 hours she has noted darkening of the discharge and increasing cramping.  The patient reports a prior pregnancy that ended in miscarriage.  Denies nausea, vomiting, diarrhea.  Patient is a 24 y.o. female presenting with vaginal bleeding.  Vaginal Bleeding Associated symptoms: no abdominal pain, no back pain, no dizziness, no dysuria, no fatigue, no nausea and no vaginal discharge     History reviewed. No pertinent past medical history. Past Surgical History  Procedure Laterality Date  . Cesarean section     Family History  Problem Relation Age of Onset  . Diabetes Mother   . Stroke Father   . Diabetes Maternal Grandmother   . Hyperlipidemia Maternal Grandmother   . Diabetes Maternal Grandfather    Social History  Substance Use Topics  . Smoking status: Never Smoker   . Smokeless tobacco: None  . Alcohol Use: No   OB History    Gravida Para Term Preterm AB TAB SAB Ectopic Multiple Living   4    1  1   2      Review of Systems  Constitutional: Negative for chills, appetite change and fatigue.  HENT: Negative for congestion, postnasal drip and rhinorrhea.   Eyes: Negative for pain and redness.  Respiratory: Negative for cough, chest tightness and shortness of breath.   Cardiovascular: Negative for chest pain and palpitations.  Gastrointestinal: Negative for nausea, vomiting, abdominal pain and  diarrhea.  Genitourinary: Positive for vaginal bleeding and pelvic pain. Negative for dysuria, frequency, flank pain and vaginal discharge.  Musculoskeletal: Negative for myalgias and back pain.  Skin: Negative for rash.  Neurological: Negative for dizziness and headaches.  Hematological: Does not bruise/bleed easily.      Allergies  Penicillins  Home Medications   Prior to Admission medications   Medication Sig Start Date End Date Taking? Authorizing Provider  Prenatal Vit-Fe Fumarate-FA (PRENATAL MULTIVITAMIN) TABS tablet Take 1 tablet by mouth daily at 12 noon.   Yes Historical Provider, MD   BP 109/64 mmHg  Pulse 77  Temp(Src) 98.5 F (36.9 C) (Oral)  Resp 17  SpO2 100%  LMP 09/29/2014 (Exact Date) Physical Exam  Constitutional: She appears well-developed and well-nourished. No distress.  HENT:  Head: Normocephalic and atraumatic.  Right Ear: External ear normal.  Left Ear: External ear normal.  Mouth/Throat: Oropharynx is clear and moist. No oropharyngeal exudate.  Eyes: EOM are normal. Pupils are equal, round, and reactive to light.  Neck: Normal range of motion. Neck supple.  Cardiovascular: Normal rate, regular rhythm, normal heart sounds and intact distal pulses.   No murmur heard. Pulmonary/Chest: Effort normal. No respiratory distress. She has no wheezes. She has no rales.  Abdominal: Soft. She exhibits no distension. There is no tenderness. There is no rebound.  Genitourinary: Cervix exhibits no motion tenderness, no discharge and no friability. Right adnexum displays tenderness (mild). Right adnexum displays no mass and no fullness. Left adnexum displays  tenderness (mild). Left adnexum displays no mass and no fullness. There is bleeding (no active bleeding but scant old blood noted) in the vagina. No signs of injury around the vagina. Vaginal discharge (physiologic appearing) found.  Os closed  Skin: She is not diaphoretic.  Vitals reviewed.   ED Course   Procedures (including critical care time) Labs Review Labs Reviewed  WET PREP, GENITAL - Abnormal; Notable for the following:    WBC, Wet Prep HPF POC FEW (*)    All other components within normal limits  HCG, QUANTITATIVE, PREGNANCY - Abnormal; Notable for the following:    hCG, Beta Chain, Quant, S 18120 (*)    All other components within normal limits  I-STAT BETA HCG BLOOD, ED ([facility], [facility], [facility] ONLY) - Abnormal; Notable for the following:    I-stat hCG, quantitative >2000.0 (*)    All other components within normal limits  CBC WITH DIFFERENTIAL/PLATELET  TYPE AND SCREEN  ABO/RH  GC/CHLAMYDIA PROBE AMP ([place]) NOT AT [facility]    Imaging Review US Ob Comp Less 14 Wks  10/13/2014   CLINICAL DATA:  Patient with cramping and bleeding.  EXAM: OBSTETRIC <14 WK [place] AND TRANSVAGINAL OB US  TECHNIQUE: Both transabdominal and transvaginal ultrasound examinations were performed for complete evaluation of the gestation as well as the maternal uterus, adnexal regions, and pelvic cul-de-sac. Transvaginal technique was performed to assess early pregnancy.  COMPARISON:  None.  FINDINGS: Intrauterine gestational sac: Visualized/normal in shape.  Yolk sac:  Present  Embryo:  Not present  Cardiac Activity: Not present  Mean sac diameter:  11.8 mm  Maternal uterus/adnexae: Corpus luteum within the right ovary. There is a 2.0 x 1.4 x 1.4 cm simple cyst within the left adnexa, potentially representing a left paraovarian cyst. Small subchorionic hemorrhage. No free fluid in the pelvis.  IMPRESSION: Probable early intrauterine gestational sac and yolk sac with no definite fetal pole or cardiac activity yet visualized. Recommend follow-up quantitative B-HCG levels and follow-up US in 14 days to confirm and assess viability. This recommendation follows SRU consensus guidelines: Diagnostic Criteria for Nonviable Pregnancy Early in the First Trimester. Malva Limes Med 2013; 161:0960-45.  Small subchorionic hemorrhage.    Electronically Signed   By: Annia Belt M.D.   On: 10/13/2014 20:03   US Ob Transvaginal  10/13/2014   CLINICAL DATA:  Patient with cramping and bleeding.  EXAM: OBSTETRIC <14 WK [place] AND TRANSVAGINAL OB US  TECHNIQUE: Both transabdominal and transvaginal ultrasound examinations were performed for complete evaluation of the gestation as well as the maternal uterus, adnexal regions, and pelvic cul-de-sac. Transvaginal technique was performed to assess early pregnancy.  COMPARISON:  None.  FINDINGS: Intrauterine gestational sac: Visualized/normal in shape.  Yolk sac:  Present  Embryo:  Not present  Cardiac Activity: Not present  Mean sac diameter:  11.8 mm  Maternal uterus/adnexae: Corpus luteum within the right ovary. There is a 2.0 x 1.4 x 1.4 cm simple cyst within the left adnexa, potentially representing a left paraovarian cyst. Small subchorionic hemorrhage. No free fluid in the pelvis.  IMPRESSION: Probable early intrauterine gestational sac and yolk sac with no definite fetal pole or cardiac activity yet visualized. Recommend follow-up quantitative B-HCG levels and follow-up US in 14 days to confirm and assess viability. This recommendation follows SRU consensus guidelines: Diagnostic Criteria for Nonviable Pregnancy Early in the First Trimester. Malva Limes Med 2013; 409:8119-14.  Small subchorionic hemorrhage.   Electronically Signed   By: Annia Belt  M.D.   On: 10/13/2014 20:03   I have personally reviewed and evaluated these images and lab results as part of my medical decision-making.   EKG Interpretation None      MDM  Patient seen and evaluated in stable condition for vaginal bleeding and cramping in early pregnancy.  HCG 18,120.  US showed IUP but no heart rate.  Discussed results with patient as well as concern that findings and symptoms concerning for miscarriage.  Patient educated on need for follow up.  She stated she would call and arrange follow up as soon as possible with her OBGYN.   Patient was discharged home in stable condition with strict return precautions. Final diagnoses:  Threatened miscarriage    1. Threatened abortion    Leta Baptist, MD 10/14/14 [phone]

## 2014-10-24 ENCOUNTER — Ambulatory Visit (INDEPENDENT_AMBULATORY_CARE_PROVIDER_SITE_OTHER): Payer: BLUE CROSS/BLUE SHIELD | Admitting: Obstetrics

## 2014-10-24 ENCOUNTER — Encounter: Payer: Self-pay | Admitting: Obstetrics

## 2014-10-24 ENCOUNTER — Ambulatory Visit (HOSPITAL_COMMUNITY)
Admission: RE | Admit: 2014-10-24 | Discharge: 2014-10-24 | Disposition: A | Discharge status: Home / Self Care | Payer: BLUE CROSS/BLUE SHIELD | Source: Ambulatory Visit | Attending: Obstetrics | Admitting: Obstetrics

## 2014-10-24 ENCOUNTER — Other Ambulatory Visit: Payer: Self-pay | Admitting: Obstetrics

## 2014-10-24 VITALS — BP 102/69 | HR 72 | Wt 138.0 lb

## 2014-10-24 DIAGNOSIS — O200 Threatened abortion: Secondary | ICD-10-CM | POA: Diagnosis not present

## 2014-10-24 DIAGNOSIS — Z36 Encounter for antenatal screening of mother: Secondary | ICD-10-CM | POA: Diagnosis not present

## 2014-10-24 DIAGNOSIS — Z3A01 Less than 8 weeks gestation of pregnancy: Secondary | ICD-10-CM | POA: Diagnosis not present

## 2014-10-24 DIAGNOSIS — O208 Other hemorrhage in early pregnancy: Secondary | ICD-10-CM | POA: Diagnosis not present

## 2014-10-24 DIAGNOSIS — O3680X1 Pregnancy with inconclusive fetal viability, fetus 1: Secondary | ICD-10-CM

## 2014-10-24 MED ORDER — PNV PRENATAL PLUS MULTIVITAMIN 27-1 MG PO TABS: 1.0000 | ORAL_TABLET | Freq: Every day | ORAL | Status: DC

## 2014-10-24 NOTE — Progress Notes (Signed)
Patient ID: Jenny Randolph, female   DOB: Oct 08, 1990, 24 y.o.   MRN: [id number]  Chief Complaint  Patient presents with  . Follow-up    hospital f/u    HPI Jenny Randolph is a 24 y.o. female.  Spotting.  Presents to determine viability of pregnancy.  Denies cramping.  HPI  No past medical history on file.  Past Surgical History  Procedure Laterality Date  . Cesarean section      Family History  Problem Relation Age of Onset  . Diabetes Mother   . Stroke Father   . Diabetes Maternal Grandmother   . Hyperlipidemia Maternal Grandmother   . Diabetes Maternal Grandfather     Social History Social History  Substance Use Topics  . Smoking status: Never Smoker   . Smokeless tobacco: Not on file  . Alcohol Use: No    Allergies  Allergen Reactions  . Penicillins Hives    Has patient had a PCN reaction causing immediate rash, facial/tongue/throat swelling, SOB or lightheadedness with hypotension: yes Has patient had a PCN reaction causing severe rash involving mucus membranes or skin necrosis: unknown Has patient had a PCN reaction that required hospitalization pt was in hospital when reaction occured Has patient had a PCN reaction occurring within the last 10 years: No If all of the above answers are "NO", then may proceed with Cephalosporin use.     Current Outpatient Prescriptions  Medication Sig Dispense Refill  . Prenatal Vit-Fe Fumarate-FA (PRENATAL MULTIVITAMIN) TABS tablet Take 1 tablet by mouth daily at 12 noon.     No current facility-administered medications for this visit.    Review of Systems Review of Systems Constitutional: negative for fatigue and weight loss Respiratory: negative for cough and wheezing Cardiovascular: negative for chest pain, fatigue and palpitations Gastrointestinal: negative for abdominal pain and change in bowel habits Genitourinary:negative Integument/breast: negative for nipple discharge Musculoskeletal:negative for  myalgias Neurological: negative for gait problems and tremors Behavioral/Psych: negative for abusive relationship, depression Endocrine: negative for temperature intolerance     Blood pressure 102/69, pulse 72, weight 138 lb (62.596 kg), last menstrual period 09/29/2014.  Physical Exam Physical Exam General:   alert  Skin:   no rash or abnormalities  Lungs:   clear to auscultation bilaterally  Heart:   regular rate and rhythm, S1, S2 normal, no murmur, click, rub or gallop  Breasts:   normal without suspicious masses, skin or nipple changes or axillary nodes  Abdomen:  normal findings: no organomegaly, soft, non-tender and no hernia  Pelvis:  External genitalia: normal general appearance Urinary system: urethral meatus normal and bladder without fullness, nontender Vaginal: normal without tenderness, induration or masses Cervix: normal appearance Adnexa: normal bimanual exam Uterus: anteverted and non-tender, normal size    Data Reviewed Quantitative beta Hcg = 18, 000 on 10-13-14  Assessment     Threatened abortion,     Plan    Ultrasound ordered. F/U in 4 weeks.  Orders Placed This Encounter  Procedures  . US OB Transvaginal    Standing Status: Future     Number of Occurrences: 1     Standing Expiration Date: 12/24/2015    Order Specific Question:  Reason for Exam (SYMPTOM  OR DIAGNOSIS REQUIRED)    Answer:  viability    Order Specific Question:  Preferred imaging location?    Answer:  [facility]   No orders of the defined types were placed in this encounter.    Study Result  CLINICAL DATA: Viability  EXAM: OBSTETRIC <14 WK [place] AND TRANSVAGINAL OB US  TECHNIQUE: Both transabdominal and transvaginal ultrasound examinations were performed for complete evaluation of the gestation as well as the maternal uterus, adnexal regions, and pelvic cul-de-sac. Transvaginal technique was performed to assess early pregnancy.  COMPARISON:  10/13/2014  FINDINGS: Intrauterine gestational sac: Visualized, normal shape.  Yolk sac: Visualized  Embryo: Visualized  Cardiac Activity: Visualized  Heart Rate: 144 bpm  MSD: mm w d  CRL: 10.2 mm 7 w 1 d [place] EDC: 06/11/2015  Maternal uterus/adnexae: Small subchorionic hemorrhage. No adnexal masses or free fluid.  IMPRESSION: Seven week 1 day intrauterine pregnancy. Fetal heart rate 144 beats per minute. No acute maternal findings.   Electronically Signed  By: Jenny Randolph M.D.  On: 10/24/2014 13:49

## 2014-10-27 ENCOUNTER — Telehealth: Payer: Self-pay | Admitting: *Deleted

## 2014-10-27 NOTE — Telephone Encounter (Signed)
Pt called to office stating that PNV is making her sick.  Would like to know what else she could try.  Return call to pt.  Pt states that she is not able to tolerate PNV that was prescribed by Dr Clearance Coots.  It was recommended that pt try OTC prenatal gummy vitamin.  Pt states that she will try OTC and let us know if she needs anything different.  Pt was also made aware that we have a gummy sample in office if she would like to try those as well.

## 2014-10-31 ENCOUNTER — Encounter: Payer: BLUE CROSS/BLUE SHIELD | Admitting: Obstetrics

## 2014-11-07 ENCOUNTER — Encounter: Payer: BLUE CROSS/BLUE SHIELD | Admitting: Obstetrics

## 2014-11-08 ENCOUNTER — Encounter: Payer: BLUE CROSS/BLUE SHIELD | Admitting: Certified Nurse Midwife

## 2014-11-21 ENCOUNTER — Ambulatory Visit (INDEPENDENT_AMBULATORY_CARE_PROVIDER_SITE_OTHER): Payer: BLUE CROSS/BLUE SHIELD | Admitting: Obstetrics

## 2014-11-21 ENCOUNTER — Encounter: Payer: Self-pay | Admitting: Obstetrics

## 2014-11-21 VITALS — BP 112/74 | HR 80 | Temp 98.3°F | Wt 144.0 lb

## 2014-11-21 DIAGNOSIS — Z3481 Encounter for supervision of other normal pregnancy, first trimester: Secondary | ICD-10-CM | POA: Diagnosis not present

## 2014-11-21 LAB — POCT URINALYSIS DIPSTICK
Bilirubin, UA: NEGATIVE
Blood, UA: NEGATIVE
Glucose, UA: NEGATIVE
KETONES UA: NEGATIVE
LEUKOCYTES UA: NEGATIVE
Nitrite, UA: NEGATIVE
PH UA: 7
PROTEIN UA: NEGATIVE
SPEC GRAV UA: 1.015
UROBILINOGEN UA: NEGATIVE

## 2014-11-22 ENCOUNTER — Encounter: Payer: Self-pay | Admitting: Obstetrics

## 2014-11-22 LAB — OBSTETRIC PANEL
Antibody Screen: NEGATIVE
BASOS ABS: 0 10*3/uL (ref 0.0–0.1)
Basophils Relative: 0 % (ref 0–1)
Eosinophils Absolute: 0.1 10*3/uL (ref 0.0–0.7)
Eosinophils Relative: 2 % (ref 0–5)
HCT: 40.6 % (ref 36.0–46.0)
HEMOGLOBIN: 13.4 g/dL (ref 12.0–15.0)
HEP B S AG: NEGATIVE
LYMPHS ABS: 1.5 10*3/uL (ref 0.7–4.0)
LYMPHS PCT: 24 % (ref 12–46)
MCH: 29.3 pg (ref 26.0–34.0)
MCHC: 33 g/dL (ref 30.0–36.0)
MCV: 88.6 fL (ref 78.0–100.0)
MONOS PCT: 7 % (ref 3–12)
MPV: 10.8 fL (ref 8.6–12.4)
Monocytes Absolute: 0.4 10*3/uL (ref 0.1–1.0)
Neutro Abs: 4.1 10*3/uL (ref 1.7–7.7)
Neutrophils Relative %: 67 % (ref 43–77)
PLATELETS: 218 10*3/uL (ref 150–400)
RBC: 4.58 MIL/uL (ref 3.87–5.11)
RDW: 14.7 % (ref 11.5–15.5)
RUBELLA: 3.4 {index} — AB (ref <0.90)
Rh Type: POSITIVE
WBC: 6.1 10*3/uL (ref 4.0–10.5)

## 2014-11-22 LAB — VARICELLA ZOSTER ANTIBODY, IGG: Varicella IgG: 2278 Index — ABNORMAL HIGH (ref <135.00)

## 2014-11-22 LAB — HIV ANTIBODY (ROUTINE TESTING W REFLEX): HIV: NONREACTIVE

## 2014-11-22 LAB — TSH: TSH: 1.886 u[IU]/mL (ref 0.350–4.500)

## 2014-11-22 LAB — VITAMIN D 25 HYDROXY (VIT D DEFICIENCY, FRACTURES): VIT D 25 HYDROXY: 24 ng/mL — AB (ref 30–100)

## 2014-11-22 NOTE — Progress Notes (Signed)
Subjective:    Jenny Randolph is being seen today for her first obstetrical visit.  This is not a planned pregnancy. She is at [redacted]w[redacted]d gestation. Her obstetrical history is significant for none. Relationship with FOB: significant other, not living together. Patient does intend to breast feed. Pregnancy history fully reviewed.  The information documented in the HPI was reviewed and verified.  Menstrual History: OB History    Gravida Para Term Preterm AB TAB SAB Ectopic Multiple Living   0 1 0 1 0 0 2       Patient's last menstrual period was 09/29/2014 (exact date).    Past Medical History  Diagnosis Date  . Complication of anesthesia     Hives, Rash, Itching  . Medical history non-contributory     Past Surgical History  Procedure Laterality Date  . Cesarean section    . Dilation and curettage of uterus       (Not in a hospital admission) Allergies  Allergen Reactions  . Penicillins Hives    Has patient had a PCN reaction causing immediate rash, facial/tongue/throat swelling, SOB or lightheadedness with hypotension: yes Has patient had a PCN reaction causing severe rash involving mucus membranes or skin necrosis: unknown Has patient had a PCN reaction that required hospitalization pt was in hospital when reaction occured Has patient had a PCN reaction occurring within the last 10 years: No If all of the above answers are "NO", then may proceed with Cephalosporin use.     Social History  Substance Use Topics  . Smoking status: Never Smoker   . Smokeless tobacco: Never Used  . Alcohol Use: No    Family History  Problem Relation Age of Onset  . Diabetes Mother   . Stroke Father   . Diabetes Maternal Grandmother   . Hyperlipidemia Maternal Grandmother   . Diabetes Maternal Grandfather      Review of Systems Constitutional: negative for weight loss Gastrointestinal: negative for vomiting Genitourinary:negative for genital lesions and vaginal discharge and  dysuria Musculoskeletal:negative for back pain Behavioral/Psych: negative for abusive relationship, depression, illegal drug usage and tobacco use    Objective:    BP 112/74 mmHg  Pulse 80  Temp(Src) 98.3 F (36.8 C)  Wt 144 lb (65.318 kg)  LMP 09/29/2014 (Exact Date) General Appearance:    Alert, cooperative, no distress, appears stated age  Head:    Normocephalic, without obvious abnormality, atraumatic  Eyes:    PERRL, conjunctiva/corneas clear, EOM's intact, fundi    benign, both eyes  Ears:    Normal TM's and external ear canals, both ears  Nose:   Nares normal, septum midline, mucosa normal, no drainage    or sinus tenderness  Throat:   Lips, mucosa, and tongue normal; teeth and gums normal  Neck:   Supple, symmetrical, trachea midline, no adenopathy;    thyroid:  no enlargement/tenderness/nodules; no carotid   bruit or JVD  Back:     Symmetric, no curvature, ROM normal, no CVA tenderness  Lungs:     Clear to auscultation bilaterally, respirations unlabored  Chest Wall:    No tenderness or deformity   Heart:    Regular rate and rhythm, S1 and S2 normal, no murmur, rub   or gallop  Breast Exam:    No tenderness, masses, or nipple abnormality  Abdomen:     Soft, non-tender, bowel sounds active all four quadrants,    no masses, no organomegaly  Genitalia:    Normal female without  lesion, discharge or tenderness  Extremities:   Extremities normal, atraumatic, no cyanosis or edema  Pulses:   2+ and symmetric all extremities  Skin:   Skin color, texture, turgor normal, no rashes or lesions  Lymph nodes:   Cervical, supraclavicular, and axillary nodes normal  Neurologic:   CNII-XII intact, normal strength, sensation and reflexes    throughout      Lab Review Urine pregnancy test Labs reviewed yes Radiologic studies reviewed yes Assessment:    Pregnancy at 5230w1d weeks    Plan:      Prenatal vitamins.  Counseling provided regarding continued use of seat belts,  cessation of alcohol consumption, smoking or use of illicit drugs; infection precautions i.e., influenza/TDAP immunizations, toxoplasmosis,CMV, parvovirus, listeria and varicella; workplace safety, exercise during pregnancy; routine dental care, safe medications, sexual activity, hot tubs, saunas, pools, travel, caffeine use, fish and methlymercury, potential toxins, hair treatments, varicose veins Weight gain recommendations per IOM guidelines reviewed: underweight/BMI< 18.5--> gain 28 - 40 lbs; normal weight/BMI 18.5 - 24.9--> gain 25 - 35 lbs; overweight/BMI 25 - 29.9--> gain 15 - 25 lbs; obese/BMI >30->gain  11 - 20 lbs Problem list reviewed and updated. FIRST/CF mutation testing/NIPT/QUAD SCREEN/fragile X/Ashkenazi Jewish population testing/Spinal muscular atrophy discussed: requested. Role of ultrasound in pregnancy discussed; fetal survey: requested. Amniocentesis discussed: not indicated. VBAC calculator score: VBAC consent form provided No orders of the defined types were placed in this encounter.   Orders Placed This Encounter  Procedures  . Culture, OB Urine  . SureSwab, Vaginosis/Vaginitis Plus  . Obstetric panel  . HIV antibody  . Hemoglobinopathy evaluation  . Varicella zoster antibody, IgG  . Vit D  25 hydroxy (rtn osteoporosis monitoring)  . TSH  . POCT urinalysis dipstick    Follow up in 4 weeks.

## 2014-11-23 LAB — HEMOGLOBINOPATHY EVALUATION
HGB A: 97.2 % (ref 96.8–97.8)
HGB S QUANTITAION: 0 %
Hemoglobin Other: 0 %
Hgb A2 Quant: 2.8 % (ref 2.2–3.2)
Hgb F Quant: 0 % (ref 0.0–2.0)

## 2014-11-24 ENCOUNTER — Other Ambulatory Visit: Payer: Self-pay | Admitting: Obstetrics

## 2014-11-24 DIAGNOSIS — O2341 Unspecified infection of urinary tract in pregnancy, first trimester: Secondary | ICD-10-CM

## 2014-11-24 DIAGNOSIS — O09899 Supervision of other high risk pregnancies, unspecified trimester: Secondary | ICD-10-CM

## 2014-11-24 DIAGNOSIS — Z87440 Personal history of urinary (tract) infections: Principal | ICD-10-CM

## 2014-11-24 DIAGNOSIS — B951 Streptococcus, group B, as the cause of diseases classified elsewhere: Secondary | ICD-10-CM

## 2014-11-24 LAB — CULTURE, OB URINE: Colony Count: 1000

## 2014-11-24 MED ORDER — CLINDAMYCIN HCL 300 MG PO CAPS: 300.0000 mg | ORAL_CAPSULE | Freq: Three times a day (TID) | ORAL | Status: DC

## 2014-11-26 ENCOUNTER — Other Ambulatory Visit: Payer: Self-pay | Admitting: Obstetrics

## 2014-11-26 LAB — SURESWAB, VAGINOSIS/VAGINITIS PLUS
Atopobium vaginae: NOT DETECTED Log (cells/mL)
C. ALBICANS, DNA: DETECTED — AB
C. GLABRATA, DNA: NOT DETECTED
C. TRACHOMATIS RNA, TMA: NOT DETECTED
C. parapsilosis, DNA: NOT DETECTED
C. tropicalis, DNA: NOT DETECTED
GARDNERELLA VAGINALIS: 5.1 Log (cells/mL)
LACTOBACILLUS SPECIES: >8 Log (cells/mL)
MEGASPHAERA SPECIES: NOT DETECTED Log (cells/mL)
N. gonorrhoeae RNA, TMA: NOT DETECTED
T. VAGINALIS RNA, QL TMA: NOT DETECTED

## 2014-12-19 ENCOUNTER — Ambulatory Visit (INDEPENDENT_AMBULATORY_CARE_PROVIDER_SITE_OTHER): Payer: BLUE CROSS/BLUE SHIELD | Admitting: Obstetrics

## 2014-12-19 VITALS — BP 97/68 | HR 62 | Temp 99.2°F | Wt 149.0 lb

## 2014-12-19 DIAGNOSIS — Z3482 Encounter for supervision of other normal pregnancy, second trimester: Secondary | ICD-10-CM

## 2014-12-19 LAB — POCT URINALYSIS DIPSTICK
Bilirubin, UA: NEGATIVE
Blood, UA: NEGATIVE
Glucose, UA: NEGATIVE
KETONES UA: NEGATIVE
LEUKOCYTES UA: NEGATIVE
Nitrite, UA: NEGATIVE
PH UA: 7
PROTEIN UA: NEGATIVE
SPEC GRAV UA: 1.01
Urobilinogen, UA: NEGATIVE

## 2014-12-19 NOTE — Progress Notes (Signed)
Pt is doing well.

## 2014-12-22 LAB — AFP, QUAD SCREEN
AFP: 30 ng/mL
Age Alone: 1:1070 {titer}
Curr Gest Age: 15.1 wks.days
HCG, Total: 13.55 IU/mL
INH: 138.4 pg/mL
INTERPRETATION-AFP: NEGATIVE
MOM FOR INH: 0.91
MoM for AFP: 1.09
MoM for hCG: 0.33
OPEN SPINA BIFIDA: NEGATIVE
Tri 18 Scr Risk Est: NEGATIVE
Trisomy 18 (Edward) Syndrome Interp.: 1:3570 {titer}
uE3 Mom: 0.88
uE3 Value: 0.73 ng/mL

## 2014-12-23 ENCOUNTER — Encounter: Payer: Self-pay | Admitting: Obstetrics

## 2014-12-23 NOTE — Progress Notes (Signed)
  Subjective:    Jenny Randolph is a 24 y.o. female being seen today for her obstetrical visit. She is at 6485w4d gestation. Patient reports: no complaints.  Problem List Items Addressed This Visit    None    Visit Diagnoses    Encounter for supervision of other normal pregnancy in second trimester    -  Primary    Relevant Orders    POCT urinalysis dipstick (Completed)    US OB Comp + 14 Wk    AFP, Quad Screen (Completed)      Patient Active Problem List   Diagnosis Date Noted  . Masses of both breasts 06/21/2014    Objective:     BP 97/68 mmHg  Pulse 62  Temp(Src) 99.2 F (37.3 C)  Wt 149 lb (67.586 kg)  LMP 09/29/2014 (Exact Date) Uterine Size: Below umbilicus     Assessment:    Pregnancy @ 8285w4d  weeks Doing well    Plan:    Problem list reviewed and updated. Labs reviewed.  Follow up in 4 weeks. FIRST/CF mutation testing/NIPT/QUAD SCREEN/fragile X/Ashkenazi Jewish population testing/Spinal muscular atrophy discussed: requested. Role of ultrasound in pregnancy discussed; fetal survey: requested. Amniocentesis discussed: not indicated.

## 2014-12-30 ENCOUNTER — Other Ambulatory Visit: Payer: Self-pay | Admitting: Certified Nurse Midwife

## 2015-01-01 ENCOUNTER — Emergency Department (HOSPITAL_COMMUNITY)
Admission: EM | Admit: 2015-01-01 | Discharge: 2015-01-01 | Disposition: A | Discharge status: Home / Self Care | Payer: BLUE CROSS/BLUE SHIELD | Attending: Emergency Medicine | Admitting: Emergency Medicine

## 2015-01-01 ENCOUNTER — Encounter (HOSPITAL_COMMUNITY): Payer: Self-pay

## 2015-01-01 DIAGNOSIS — Z3A17 17 weeks gestation of pregnancy: Secondary | ICD-10-CM | POA: Diagnosis not present

## 2015-01-01 DIAGNOSIS — H538 Other visual disturbances: Secondary | ICD-10-CM | POA: Diagnosis not present

## 2015-01-01 DIAGNOSIS — O99352 Diseases of the nervous system complicating pregnancy, second trimester: Secondary | ICD-10-CM | POA: Insufficient documentation

## 2015-01-01 DIAGNOSIS — R42 Dizziness and giddiness: Secondary | ICD-10-CM | POA: Diagnosis not present

## 2015-01-01 DIAGNOSIS — R1032 Left lower quadrant pain: Secondary | ICD-10-CM | POA: Insufficient documentation

## 2015-01-01 DIAGNOSIS — Z79899 Other long term (current) drug therapy: Secondary | ICD-10-CM | POA: Insufficient documentation

## 2015-01-01 DIAGNOSIS — O26899 Other specified pregnancy related conditions, unspecified trimester: Secondary | ICD-10-CM

## 2015-01-01 DIAGNOSIS — R109 Unspecified abdominal pain: Secondary | ICD-10-CM

## 2015-01-01 DIAGNOSIS — O9989 Other specified diseases and conditions complicating pregnancy, childbirth and the puerperium: Secondary | ICD-10-CM | POA: Diagnosis present

## 2015-01-01 DIAGNOSIS — Z880 Allergy status to penicillin: Secondary | ICD-10-CM | POA: Insufficient documentation

## 2015-01-01 LAB — COMPREHENSIVE METABOLIC PANEL
ALBUMIN: 3.6 g/dL (ref 3.5–5.0)
ALT: 17 U/L (ref 14–54)
AST: 21 U/L (ref 15–41)
Alkaline Phosphatase: 50 U/L (ref 38–126)
Anion gap: 7 (ref 5–15)
BUN: 10 mg/dL (ref 6–20)
CHLORIDE: 107 mmol/L (ref 101–111)
CO2: 23 mmol/L (ref 22–32)
Calcium: 9 mg/dL (ref 8.9–10.3)
Creatinine, Ser: 0.5 mg/dL (ref 0.44–1.00)
GFR calc Af Amer: >60 mL/min (ref >60)
GFR calc non Af Amer: >60 mL/min (ref >60)
GLUCOSE: 91 mg/dL (ref 65–99)
POTASSIUM: 3.9 mmol/L (ref 3.5–5.1)
SODIUM: 137 mmol/L (ref 135–145)
Total Bilirubin: 0.7 mg/dL (ref 0.3–1.2)
Total Protein: 6.8 g/dL (ref 6.5–8.1)

## 2015-01-01 LAB — URINALYSIS, ROUTINE W REFLEX MICROSCOPIC
BILIRUBIN URINE: NEGATIVE
GLUCOSE, UA: NEGATIVE mg/dL
Hgb urine dipstick: NEGATIVE
KETONES UR: NEGATIVE mg/dL
Leukocytes, UA: NEGATIVE
Nitrite: NEGATIVE
PH: 6 (ref 5.0–8.0)
Protein, ur: NEGATIVE mg/dL
Specific Gravity, Urine: 1.025 (ref 1.005–1.030)

## 2015-01-01 LAB — CBC
HEMATOCRIT: 36.7 % (ref 36.0–46.0)
Hemoglobin: 12.6 g/dL (ref 12.0–15.0)
MCH: 30.8 pg (ref 26.0–34.0)
MCHC: 34.3 g/dL (ref 30.0–36.0)
MCV: 89.7 fL (ref 78.0–100.0)
Platelets: 208 10*3/uL (ref 150–400)
RBC: 4.09 MIL/uL (ref 3.87–5.11)
RDW: 13.8 % (ref 11.5–15.5)
WBC: 7.9 10*3/uL (ref 4.0–10.5)

## 2015-01-01 LAB — LIPASE, BLOOD: LIPASE: 25 U/L (ref 11–51)

## 2015-01-01 MED ORDER — SODIUM CHLORIDE 0.9 % IV BOLUS (SEPSIS)
1000.0000 mL | Freq: Once | INTRAVENOUS | Status: AC
  Administered 2015-01-01: 1000 mL via INTRAVENOUS

## 2015-01-01 NOTE — ED Notes (Signed)
Pt c/o sharp L side abdominal x 2 days and dizziness and blurred vision in R eye yesterday.  Pain score 7/10.  Pt has not taken anything for pain.  Pt reports dizziness and blurred vision have resolved.  Pt is [redacted] weeks pregnant and is followed by [facility].  Denies vaginal bleeding and GU complaints.

## 2015-01-01 NOTE — ED Provider Notes (Signed)
CSN: [id number]     Arrival date & time 01/01/15  1037 History   First MD Initiated Contact with Patient 01/01/15 1059     Chief Complaint  Patient presents with  . Abdominal Pain  . Dizziness    HPI  Patient presents with concern of left-sided abdominal pain, dizziness, blurry vision. Patient is about [redacted] weeks pregnant, has had multiple reassuring OB visits thus far. Over the past 2 days she has had persistent sharp pain about the left lateral abdomen. Yesterday she had one episode of dizziness, as well as transient blurry vision in the right eye. Currently she has no dizziness, or visual changes. This is her third pregnancy. She denies any ongoing vaginal bleeding, discharge, lower abdominal cramping, though she states that the left-sided abdominal pain is now radiating towards the suprapubic region. No medication taken for pain relief, no clear relieving, exacerbating, precipitating factors.  Past Medical History  Diagnosis Date  . Complication of anesthesia     Hives, Rash, Itching  . Medical history non-contributory    Past Surgical History  Procedure Laterality Date  . Cesarean section    . Dilation and curettage of uterus     Family History  Problem Relation Age of Onset  . Diabetes Mother   . Stroke Father   . Diabetes Maternal Grandmother   . Hyperlipidemia Maternal Grandmother   . Diabetes Maternal Grandfather    Social History  Substance Use Topics  . Smoking status: Never Smoker   . Smokeless tobacco: Never Used  . Alcohol Use: No   OB History    Gravida Para Term Preterm AB TAB SAB Ectopic Multiple Living   0 1 0 1 0 0 2     Review of Systems  Constitutional:       Per HPI, otherwise negative  HENT:       Per HPI, otherwise negative  Respiratory:       Per HPI, otherwise negative  Cardiovascular:       Per HPI, otherwise negative  Gastrointestinal: Negative for vomiting.  Endocrine:       Negative aside from HPI  Genitourinary:   Neg aside from HPI   Musculoskeletal:       Per HPI, otherwise negative  Skin: Negative.   Neurological: Positive for dizziness. Negative for syncope.      Allergies  Penicillins  Home Medications   Prior to Admission medications   Medication Sig Start Date End Date Taking? Authorizing Provider  Prenatal Vit-Fe Fumarate-FA (PNV PRENATAL PLUS MULTIVITAMIN) 27-1 MG TABS Take 1 tablet by mouth daily before breakfast. 10/24/14  Yes Brock Bad, MD  clindamycin (CLEOCIN) 300 MG capsule Take 1 capsule (300 mg total) by mouth 3 (three) times daily. Patient not taking: Reported on 01/01/2015 11/24/14   Brock Bad, MD   BP 112/73 mmHg  Pulse 88  Temp(Src) 97.9 F (36.6 C) (Oral)  Resp 16  SpO2 100%  LMP 09/29/2014 (Exact Date) Physical Exam  Constitutional: She is oriented to person, place, and time. She appears well-developed and well-nourished. No distress.  HENT:  Head: Normocephalic and atraumatic.  Eyes: Conjunctivae and EOM are normal.  Cardiovascular: Normal rate and regular rhythm.   Pulmonary/Chest: Effort normal and breath sounds normal. No stridor. No respiratory distress.  Abdominal: She exhibits no distension. There is no tenderness.    Musculoskeletal: She exhibits no edema.  Neurological: She is alert and oriented to person, place, and time. No cranial nerve deficit.  Skin: Skin is warm and dry.  Psychiatric: She has a normal mood and affect.  Nursing note and vitals reviewed.   ED Course  Procedures (including critical care time) Labs Review Labs Reviewed  LIPASE, BLOOD  COMPREHENSIVE METABOLIC PANEL  CBC  URINALYSIS, ROUTINE W REFLEX MICROSCOPIC (NOT AT [facility])     EMERGENCY DEPARTMENT US PREGNANCY "Study: Limited Ultrasound of the Pelvis"  INDICATIONS:Pregnancy(required) Multiple views of the uterus and pelvic cavity are obtained with a multi-frequency probe.  APPROACH:Transabdominal   PERFORMED BY: Myself  IMAGES ARCHIVED?:  No  LIMITATIONS: None  PREGNANCY FREE FLUID: None  PREGNANCY UTERUS FINDINGS:Uterus enlarged and Gestational sac noted ADNEXAL FINDINGS:Left ovary not seen and Right ovary not seen  PREGNANCY FINDINGS: Intrauterine gestational sac noted and Fetal heart activity seen  INTERPRETATION: Viable intrauterine pregnancy  GESTATIONAL AGE, ESTIMATE: 725m  FETAL HEART RATE: 140's  COMMENT(Estimate of Gestational Age):  4025m       MDM  Young female presents with ongoing left-sided abdominal pain, lower abdominal pain. Ultrasound was reassuring, with ongoing fetal activity. No evidence for free abdominal fluid. Labs are reassuring, with no evidence for eclampsia. With reassuring findings, after fluid resuscitation, the patient was discharged to follow her obstetrics team.  Gerhard Munchobert Cassidy Tashiro, MD 01/01/15 1224

## 2015-01-01 NOTE — ED Notes (Signed)
FHT 126 via doppler. Pt does feel movement with baby, denies any pain to lower abd.

## 2015-01-01 NOTE — Discharge Instructions (Signed)
As discussed, your evaluation today has been largely reassuring.  But, it is important that you monitor your condition carefully, and do not hesitate to return to the ED if you develop new, or concerning changes in your condition. ? ?Otherwise, please follow-up with your physician for appropriate ongoing care. ? ?

## 2015-01-12 ENCOUNTER — Encounter: Payer: Self-pay | Admitting: Obstetrics

## 2015-01-12 ENCOUNTER — Ambulatory Visit (INDEPENDENT_AMBULATORY_CARE_PROVIDER_SITE_OTHER): Payer: BLUE CROSS/BLUE SHIELD | Admitting: Obstetrics

## 2015-01-12 ENCOUNTER — Ambulatory Visit (INDEPENDENT_AMBULATORY_CARE_PROVIDER_SITE_OTHER): Payer: BLUE CROSS/BLUE SHIELD

## 2015-01-12 VITALS — BP 110/73 | HR 81 | Temp 97.7°F | Wt 157.0 lb

## 2015-01-12 DIAGNOSIS — Z3482 Encounter for supervision of other normal pregnancy, second trimester: Secondary | ICD-10-CM

## 2015-01-12 DIAGNOSIS — Z36 Encounter for antenatal screening of mother: Secondary | ICD-10-CM | POA: Diagnosis not present

## 2015-01-12 LAB — POCT URINALYSIS DIPSTICK
BILIRUBIN UA: NEGATIVE
Blood, UA: NEGATIVE
GLUCOSE UA: NEGATIVE
Ketones, UA: NEGATIVE
LEUKOCYTES UA: NEGATIVE
NITRITE UA: NEGATIVE
Protein, UA: NEGATIVE
Spec Grav, UA: 1.015
Urobilinogen, UA: NEGATIVE
pH, UA: 7

## 2015-01-12 NOTE — Progress Notes (Signed)
Subjective:    Jenny Randolph is a 24 y.o. female being seen today for her obstetrical visit. She is at 558w3d gestation. Patient reports: backache . Fetal movement: normal.  Problem List Items Addressed This Visit    None    Visit Diagnoses    Supervision of other normal pregnancy, antepartum, second trimester    -  Primary    Relevant Orders    POCT urinalysis dipstick (Completed)      Patient Active Problem List   Diagnosis Date Noted  . Masses of both breasts 06/21/2014   Objective:    BP 110/73 mmHg  Pulse 81  Temp(Src) 97.7 F (36.5 C)  Wt 157 lb (71.215 kg)  LMP 09/29/2014 (Exact Date) FHT: 150 BPM  Uterine Size: size equals dates     Assessment:    Pregnancy @ 2258w3d    Plan:    Signs and symptoms of preterm labor: discussed.  Labs, problem list reviewed and updated 2 hr GTT planned Follow up in 4 weeks.

## 2015-01-29 NOTE — L&D Delivery Note (Signed)
  Delivery Note   Requested by Dr. Clearance CootsHarper to attend this  repeat C-section delivery at [redacted] weeks GA.   Born to a G4P2, GBS unknown [facility]  No prenatal complications.   Intrapartum course complicated by previous c-section. ROM occurred at delivery with clear fluid.   Infant vigorous with good spontaneous cry.  Routine NRP followed including warming, drying and stimulation.  Apgars 9 / 9.  Physical exam within normal limits .  Left in OR for skin-to-skin contact with mother, in care of CN staff.  Care transferred to Pediatrician.  Fairy A. Effie Shyoleman, NNP-BC

## 2015-02-13 ENCOUNTER — Ambulatory Visit (INDEPENDENT_AMBULATORY_CARE_PROVIDER_SITE_OTHER): Payer: BLUE CROSS/BLUE SHIELD | Admitting: Obstetrics

## 2015-02-13 ENCOUNTER — Encounter: Payer: Self-pay | Admitting: Obstetrics

## 2015-02-13 VITALS — BP 104/66 | HR 93 | Temp 98.3°F | Wt 164.0 lb

## 2015-02-13 DIAGNOSIS — Z3482 Encounter for supervision of other normal pregnancy, second trimester: Secondary | ICD-10-CM

## 2015-02-13 LAB — POCT URINALYSIS DIPSTICK
Bilirubin, UA: NEGATIVE
Glucose, UA: NEGATIVE
Ketones, UA: NEGATIVE
Leukocytes, UA: NEGATIVE
NITRITE UA: NEGATIVE
PROTEIN UA: NEGATIVE
RBC UA: NEGATIVE
UROBILINOGEN UA: NEGATIVE
pH, UA: 8

## 2015-02-13 NOTE — Progress Notes (Signed)
Subjective:    Jenny Randolph is a 25 y.o. female being seen today for her obstetrical visit. She is at 6021w0d gestation. Patient reports: no complaints . Fetal movement: normal.  Problem List Items Addressed This Visit    None    Visit Diagnoses    Supervision of other normal pregnancy, antepartum, second trimester    -  Primary    Relevant Orders    POCT urinalysis dipstick (Completed)      Patient Active Problem List   Diagnosis Date Noted  . Masses of both breasts 06/21/2014   Objective:    BP 104/66 mmHg  Pulse 93  Temp(Src) 98.3 F (36.8 C)  Wt 164 lb (74.39 kg)  LMP 09/29/2014 (Exact Date) FHT: 150 BPM  Uterine Size: size equals dates     Assessment:    Pregnancy @ 7921w0d    Plan:    OBGCT: ordered.  Labs, problem list reviewed and updated 2 hr GTT planned Follow up in 2 weeks.

## 2015-03-13 ENCOUNTER — Inpatient Hospital Stay (HOSPITAL_COMMUNITY)
Admission: AD | Admit: 2015-03-13 | Discharge: 2015-03-13 | Disposition: A | Discharge status: Home / Self Care | Payer: BLUE CROSS/BLUE SHIELD | Source: Ambulatory Visit | Attending: Obstetrics | Admitting: Obstetrics

## 2015-03-13 ENCOUNTER — Encounter (HOSPITAL_COMMUNITY): Payer: Self-pay

## 2015-03-13 ENCOUNTER — Encounter: Payer: BLUE CROSS/BLUE SHIELD | Admitting: Obstetrics

## 2015-03-13 ENCOUNTER — Ambulatory Visit (INDEPENDENT_AMBULATORY_CARE_PROVIDER_SITE_OTHER): Payer: BLUE CROSS/BLUE SHIELD | Admitting: Obstetrics

## 2015-03-13 VITALS — BP 105/72 | HR 103 | Temp 98.4°F | Wt 166.0 lb

## 2015-03-13 DIAGNOSIS — B3731 Acute candidiasis of vulva and vagina: Secondary | ICD-10-CM

## 2015-03-13 DIAGNOSIS — Z3A28 28 weeks gestation of pregnancy: Secondary | ICD-10-CM | POA: Diagnosis not present

## 2015-03-13 DIAGNOSIS — Z880 Allergy status to penicillin: Secondary | ICD-10-CM | POA: Insufficient documentation

## 2015-03-13 DIAGNOSIS — N898 Other specified noninflammatory disorders of vagina: Secondary | ICD-10-CM | POA: Diagnosis present

## 2015-03-13 DIAGNOSIS — B373 Candidiasis of vulva and vagina: Secondary | ICD-10-CM | POA: Diagnosis not present

## 2015-03-13 DIAGNOSIS — O26893 Other specified pregnancy related conditions, third trimester: Secondary | ICD-10-CM

## 2015-03-13 DIAGNOSIS — O98813 Other maternal infectious and parasitic diseases complicating pregnancy, third trimester: Secondary | ICD-10-CM | POA: Diagnosis not present

## 2015-03-13 DIAGNOSIS — Z3483 Encounter for supervision of other normal pregnancy, third trimester: Secondary | ICD-10-CM

## 2015-03-13 LAB — WET PREP, GENITAL
Clue Cells Wet Prep HPF POC: NONE SEEN
SPERM: NONE SEEN
Trich, Wet Prep: NONE SEEN

## 2015-03-13 MED ORDER — TERCONAZOLE 0.8 % VA CREA: 1.0000 | TOPICAL_CREAM | Freq: Every day | VAGINAL | Status: DC

## 2015-03-13 NOTE — MAU Note (Signed)
Pt c/o of yellow vaginal discharge that she noticed about an hour ago. Does not itch or have an odor. Denies vaginal bleeding and contractions. +FM. Was seen in the office today-did not mention to Dr Clearance Coots bc she states she noticed if after her appt.

## 2015-03-13 NOTE — MAU Provider Note (Signed)
History     CSN: [id number]  Arrival date and time: 03/13/15 2033   None     Chief Complaint  Patient presents with  . Vaginal Discharge   HPI Comments: Jenny Randolph is a 25 y.o. [id number] at [redacted]w[redacted]d who presents with vaginal discharge. Was seen in office today for routine ob visit. States discharge started after office visit.   Female GU Problem The patient's primary symptoms include vaginal discharge. The patient's pertinent negatives include no genital itching, genital odor or vaginal bleeding. This is a new problem. The current episode started today. She is pregnant. Pertinent negatives include no abdominal pain, diarrhea, dysuria, fever, frequency, nausea or vomiting. The vaginal discharge was yellow and thick. There has been no bleeding. She is sexually active. No, her partner does not have an STD. There is no history of PID, an STD or vaginosis.   OB History    Gravida Para Term Preterm AB TAB SAB Ectopic Multiple Living   0 1 0 1 0 0 2      Past Medical History  Diagnosis Date  . Complication of anesthesia     Hives, Rash, Itching  . Medical history non-contributory     Past Surgical History  Procedure Laterality Date  . Cesarean section    . Dilation and curettage of uterus      Family History  Problem Relation Age of Onset  . Diabetes Mother   . Stroke Father   . Diabetes Maternal Grandmother   . Hyperlipidemia Maternal Grandmother   . Diabetes Maternal Grandfather     Social History  Substance Use Topics  . Smoking status: Never Smoker   . Smokeless tobacco: Never Used  . Alcohol Use: No    Allergies:  Allergies  Allergen Reactions  . Penicillins Hives    The whole -cillin family.  Has patient had a PCN reaction causing immediate rash, facial/tongue/throat swelling, SOB or lightheadedness with hypotension: yes Has patient had a PCN reaction causing severe rash involving mucus membranes or skin necrosis: unknown Has patient had a PCN  reaction that required hospitalization pt was in hospital when reaction occured Has patient had a PCN reaction occurring within the last 10 years: No If all of the above answers are "NO", then may proceed with Cephalospo    Prescriptions prior to admission  Medication Sig Dispense Refill Last Dose  . Prenatal Vit-Fe Fumarate-FA (PNV PRENATAL PLUS MULTIVITAMIN) 27-1 MG TABS Take 1 tablet by mouth daily before breakfast. 30 tablet 11 03/13/2015 at Unknown time    Review of Systems  Constitutional: Negative for fever.  Gastrointestinal: Negative for nausea, vomiting, abdominal pain and diarrhea.  Genitourinary: Positive for vaginal discharge. Negative for dysuria and frequency.       No bleeding or LOF + vaginal discharge   Physical Exam   Blood pressure 109/62, pulse 88, temperature 98.8 F (37.1 C), temperature source Oral, resp. rate 20, last menstrual period 09/29/2014.  Physical Exam  Nursing note and vitals reviewed. Constitutional: She is oriented to person, place, and time. She appears well-developed and well-nourished. No distress.  HENT:  Head: Normocephalic and atraumatic.  Eyes: Conjunctivae are normal. Right eye exhibits no discharge. Left eye exhibits no discharge. No scleral icterus.  Neck: Normal range of motion.  Cardiovascular: Normal rate.   Respiratory: Effort normal. No respiratory distress.  GI: Soft.  Genitourinary: Cervix exhibits no discharge. No bleeding in the vagina. Vaginal discharge (small amount of yellow thick discharge) found.  Cervix visually closed Bilateral labia erythematous  Neurological: She is alert and oriented to person, place, and time.  Skin: Skin is warm and dry. She is not diaphoretic.  Psychiatric: She has a normal mood and affect. Her behavior is normal. Judgment and thought content normal.   Fetal Tracing:  Baseline: 140 Variability: moderate Accelerations: 15x15 Decelerations: none  Toco: none   MAU Course   Procedures Results for orders placed or performed during the hospital encounter of 03/13/15 (from the past 24 hour(s))  Wet prep, genital     Status: Abnormal   Collection Time: 03/13/15  9:19 PM  Result Value Ref Range   Yeast Wet Prep HPF POC PRESENT (A) NONE SEEN   Trich, Wet Prep NONE SEEN NONE SEEN   Clue Cells Wet Prep HPF POC NONE SEEN NONE SEEN   WBC, Wet Prep HPF POC MODERATE (A) NONE SEEN   Sperm NONE SEEN     MDM Category 1 tracing, no contractions Cervix visually closed Wet prep sent Assessment and Plan  A: 1. Vaginal yeast infection   2. Vaginal discharge in pregnancy in third trimester     P: Discharge home Rx terazol Discussed reasons to return  Judeth Horn 03/13/2015, 8:59 PM

## 2015-03-13 NOTE — Discharge Instructions (Signed)

## 2015-03-14 ENCOUNTER — Encounter: Payer: Self-pay | Admitting: Obstetrics

## 2015-03-14 NOTE — Progress Notes (Signed)
Subjective:    Jenny Randolph is a 25 y.o. female being seen today for her obstetrical visit. She is at [redacted]w[redacted]d gestation. Patient reports: no complaints . Fetal movement: normal.  Problem List Items Addressed This Visit    None    Visit Diagnoses    Supervision of other normal pregnancy, antepartum, second trimester    -  Primary    Relevant Orders    POCT urinalysis dipstick      Patient Active Problem List   Diagnosis Date Noted  . Masses of both breasts 06/21/2014   Objective:    BP 105/72 mmHg  Pulse 103  Temp(Src) 98.4 F (36.9 C)  Wt 166 lb (75.297 kg)  LMP 09/29/2014 (Exact Date) FHT: 150 BPM  Uterine Size: size equals dates     Assessment:    Pregnancy @ [redacted]w[redacted]d    Plan:    OBGCT: ordered. Signs and symptoms of preterm labor: discussed.  Labs, problem list reviewed and updated 2 hr GTT planned Follow up in 2 weeks.

## 2015-03-27 ENCOUNTER — Encounter: Payer: BLUE CROSS/BLUE SHIELD | Admitting: Obstetrics

## 2015-03-27 ENCOUNTER — Other Ambulatory Visit: Payer: BLUE CROSS/BLUE SHIELD

## 2015-03-28 ENCOUNTER — Other Ambulatory Visit: Payer: BLUE CROSS/BLUE SHIELD

## 2015-03-28 ENCOUNTER — Ambulatory Visit (INDEPENDENT_AMBULATORY_CARE_PROVIDER_SITE_OTHER): Payer: BLUE CROSS/BLUE SHIELD | Admitting: Obstetrics

## 2015-03-28 ENCOUNTER — Encounter: Payer: Self-pay | Admitting: Obstetrics

## 2015-03-28 VITALS — BP 106/67 | HR 80 | Temp 97.8°F | Wt 170.0 lb

## 2015-03-28 DIAGNOSIS — K219 Gastro-esophageal reflux disease without esophagitis: Secondary | ICD-10-CM

## 2015-03-28 DIAGNOSIS — Z3483 Encounter for supervision of other normal pregnancy, third trimester: Secondary | ICD-10-CM

## 2015-03-28 LAB — POCT URINALYSIS DIPSTICK
Bilirubin, UA: NEGATIVE
Glucose, UA: NEGATIVE
Ketones, UA: NEGATIVE
NITRITE UA: NEGATIVE
PH UA: 7
PROTEIN UA: NEGATIVE
RBC UA: NEGATIVE
SPEC GRAV UA: 1.02
UROBILINOGEN UA: NEGATIVE

## 2015-03-28 LAB — CBC
HCT: 36.7 % (ref 36.0–46.0)
Hemoglobin: 12.2 g/dL (ref 12.0–15.0)
MCH: 30.2 pg (ref 26.0–34.0)
MCHC: 33.2 g/dL (ref 30.0–36.0)
MCV: 90.8 fL (ref 78.0–100.0)
MPV: 10.6 fL (ref 8.6–12.4)
PLATELETS: 195 10*3/uL (ref 150–400)
RBC: 4.04 MIL/uL (ref 3.87–5.11)
RDW: 14.2 % (ref 11.5–15.5)
WBC: 7.5 10*3/uL (ref 4.0–10.5)

## 2015-03-28 MED ORDER — RANITIDINE HCL 150 MG PO TABS: 150.0000 mg | ORAL_TABLET | Freq: Two times a day (BID) | ORAL | Status: DC

## 2015-03-28 NOTE — Progress Notes (Signed)
Subjective:    Jenny Randolph is a 25 y.o. female being seen today for her obstetrical visit. She is at [redacted]w[redacted]d gestation. Patient reports heartburn. Fetal movement: normal.  Problem List Items Addressed This Visit    None    Visit Diagnoses    Supervision of other normal pregnancy, antepartum, third trimester    -  Primary    Relevant Orders    POCT urinalysis dipstick (Completed)    Glucose Tolerance, 2 Hours w/1 Hour    CBC    HIV antibody    RPR    GERD without esophagitis        Relevant Medications    ranitidine (ZANTAC) 150 MG tablet      Patient Active Problem List   Diagnosis Date Noted  . Masses of both breasts 06/21/2014   Objective:    BP 106/67 mmHg  Pulse 80  Temp(Src) 97.8 F (36.6 C)  Wt 170 lb (77.111 kg)  LMP 09/29/2014 (Exact Date) FHT:  150 BPM  Uterine Size: size equals dates  Presentation: unsure     Assessment:    Pregnancy @ [redacted]w[redacted]d weeks   Plan:     labs reviewed, problem list updated Consent signed. GBS sent TDAP offered  Rhogam given for RH negative Pediatrician: discussed. Infant feeding: plans to breastfeed. Maternity leave: discussed. Cigarette smoking: never smoked. Orders Placed This Encounter  Procedures  . Glucose Tolerance, 2 Hours w/1 Hour  . CBC  . HIV antibody  . RPR  . POCT urinalysis dipstick   Meds ordered this encounter  Medications  . ranitidine (ZANTAC) 150 MG tablet    Sig: Take 1 tablet (150 mg total) by mouth 2 (two) times daily.    Dispense:  60 tablet    Refill:  5   Follow up in 2 Weeks.

## 2015-03-29 LAB — GLUCOSE TOLERANCE, 2 HOURS W/ 1HR
GLUCOSE, 2 HOUR: 74 mg/dL (ref <140)
GLUCOSE: 100 mg/dL
Glucose, Fasting: 74 mg/dL (ref 65–99)

## 2015-03-29 LAB — HIV ANTIBODY (ROUTINE TESTING W REFLEX): HIV: NONREACTIVE

## 2015-03-29 LAB — RPR

## 2015-04-10 ENCOUNTER — Ambulatory Visit (INDEPENDENT_AMBULATORY_CARE_PROVIDER_SITE_OTHER): Payer: BLUE CROSS/BLUE SHIELD | Admitting: Obstetrics

## 2015-04-10 VITALS — BP 107/71 | HR 105 | Temp 98.2°F | Wt 176.0 lb

## 2015-04-10 DIAGNOSIS — Z3483 Encounter for supervision of other normal pregnancy, third trimester: Secondary | ICD-10-CM

## 2015-04-10 LAB — POCT URINALYSIS DIPSTICK
Bilirubin, UA: NEGATIVE
GLUCOSE UA: NEGATIVE
Ketones, UA: NEGATIVE
Leukocytes, UA: NEGATIVE
Nitrite, UA: NEGATIVE
Protein, UA: NEGATIVE
RBC UA: NEGATIVE
SPEC GRAV UA: 1.01
UROBILINOGEN UA: NEGATIVE
pH, UA: 5

## 2015-04-11 ENCOUNTER — Encounter: Payer: Self-pay | Admitting: Obstetrics

## 2015-04-11 NOTE — Progress Notes (Signed)
Subjective:    Jenny Randolph is a 25 y.o. female being seen today for her obstetrical visit. She is at 4415w1d gestation. Patient reports no complaints. Fetal movement: normal.  Problem List Items Addressed This Visit    None    Visit Diagnoses    Supervision of other normal pregnancy, antepartum, third trimester    -  Primary    Relevant Orders    POCT urinalysis dipstick (Completed)      Patient Active Problem List   Diagnosis Date Noted  . Masses of both breasts 06/21/2014   Objective:    BP 107/71 mmHg  Pulse 105  Temp(Src) 98.2 F (36.8 C)  Wt 176 lb (79.833 kg)  LMP 09/29/2014 (Exact Date) FHT:  150 BPM  Uterine Size: size equals dates  Presentation: unsure     Assessment:    Pregnancy @ 5415w1d weeks   Plan:     labs reviewed, problem list updated Consent signed. GBS sent TDAP offered  Rhogam given for RH negative Pediatrician: discussed. Infant feeding: plans to breastfeed. Maternity leave: discussed. Cigarette smoking: never smoked. Orders Placed This Encounter  Procedures  . POCT urinalysis dipstick   No orders of the defined types were placed in this encounter.   Follow up in 2 Weeks.

## 2015-04-24 ENCOUNTER — Other Ambulatory Visit: Payer: Self-pay | Admitting: Certified Nurse Midwife

## 2015-04-24 ENCOUNTER — Ambulatory Visit (INDEPENDENT_AMBULATORY_CARE_PROVIDER_SITE_OTHER): Payer: BLUE CROSS/BLUE SHIELD | Admitting: Obstetrics

## 2015-04-24 VITALS — BP 104/70 | HR 99 | Temp 98.7°F | Wt 177.8 lb

## 2015-04-24 DIAGNOSIS — Z3493 Encounter for supervision of normal pregnancy, unspecified, third trimester: Secondary | ICD-10-CM

## 2015-04-24 LAB — POCT URINALYSIS DIPSTICK
BILIRUBIN UA: NEGATIVE
Blood, UA: NEGATIVE
GLUCOSE UA: NEGATIVE
KETONES UA: NEGATIVE
Nitrite, UA: NEGATIVE
PH UA: 6
Spec Grav, UA: 1.025
Urobilinogen, UA: NEGATIVE

## 2015-04-24 NOTE — Progress Notes (Signed)
Patient reports no concerns today. Patient unable to void before being seen.

## 2015-04-27 ENCOUNTER — Encounter: Payer: Self-pay | Admitting: Obstetrics

## 2015-04-27 NOTE — Progress Notes (Signed)
Subjective:    Jenny Randolph is a 25 y.o. female being seen today for her obstetrical visit. She is at 2267w3d gestation. Patient reports no complaints. Fetal movement: normal.  Problem List Items Addressed This Visit    None    Visit Diagnoses    Prenatal care, third trimester    -  Primary    Relevant Orders    POCT urinalysis dipstick (Completed)      Patient Active Problem List   Diagnosis Date Noted  . Masses of both breasts 06/21/2014   Objective:    BP 104/70 mmHg  Pulse 99  Temp(Src) 98.7 F (37.1 C)  Wt 177 lb 12.8 oz (80.65 kg)  LMP 09/29/2014 (Exact Date) FHT:  150 BPM  Uterine Size: size equals dates  Presentation: unsure     Assessment:    Pregnancy @ 267w3d weeks   Plan:     labs reviewed, problem list updated Consent signed. GBS sent TDAP offered  Rhogam given for RH negative Pediatrician: discussed. Infant feeding: plans to breastfeed. Maternity leave: discussed. Cigarette smoking: never smoked. Orders Placed This Encounter  Procedures  . POCT urinalysis dipstick   No orders of the defined types were placed in this encounter.   Follow up in 1 Week.

## 2015-05-01 ENCOUNTER — Encounter: Payer: Self-pay | Admitting: Obstetrics

## 2015-05-01 ENCOUNTER — Ambulatory Visit (INDEPENDENT_AMBULATORY_CARE_PROVIDER_SITE_OTHER): Payer: BLUE CROSS/BLUE SHIELD | Admitting: Obstetrics

## 2015-05-01 VITALS — BP 94/64 | HR 96 | Temp 98.0°F | Wt 179.0 lb

## 2015-05-01 DIAGNOSIS — Z3493 Encounter for supervision of normal pregnancy, unspecified, third trimester: Secondary | ICD-10-CM

## 2015-05-01 LAB — POCT URINALYSIS DIPSTICK
BILIRUBIN UA: NEGATIVE
Blood, UA: NEGATIVE
GLUCOSE UA: NEGATIVE
Ketones, UA: NEGATIVE
Leukocytes, UA: NEGATIVE
NITRITE UA: NEGATIVE
Protein, UA: NEGATIVE
Spec Grav, UA: 1.015
UROBILINOGEN UA: 0.2
pH, UA: 6

## 2015-05-01 NOTE — Progress Notes (Signed)
Subjective:    JocelyKerrie Buffalon M Perin is a 25 y.o. female being seen today for her obstetrical visit. She is at 7081w0d gestation. Patient reports no complaints. Fetal movement: normal.  Problem List Items Addressed This Visit    None    Visit Diagnoses    Pregnancy, obstetrical care, third trimester    -  Primary    Relevant Orders    POCT urinalysis dipstick (Completed)      Patient Active Problem List   Diagnosis Date Noted  . Masses of both breasts 06/21/2014   Objective:    BP 94/64 mmHg  Pulse 96  Temp(Src) 98 F (36.7 C)  Wt 179 lb (81.194 kg)  LMP 09/29/2014 (Exact Date) FHT:  150 BPM  Uterine Size: size equals dates  Presentation: cephalic     Assessment:    Pregnancy @ 6081w0d weeks   Plan:     labs reviewed, problem list updated Consent signed. GBS sent TDAP offered  Rhogam given for RH negative Pediatrician: discussed. Infant feeding: plans to breastfeed. Maternity leave: discussed. Cigarette smoking: never smoked. Orders Placed This Encounter  Procedures  . POCT urinalysis dipstick   No orders of the defined types were placed in this encounter.   Follow up in 1 Week.

## 2015-05-01 NOTE — Progress Notes (Signed)
Urine Collected

## 2015-05-02 ENCOUNTER — Telehealth: Payer: Self-pay | Admitting: *Deleted

## 2015-05-02 NOTE — Telephone Encounter (Signed)
Patient states she has been having what she thinks is contractions since yesterday at around 7pm. 6:03 Call to patient-  She has tried comfort measures- but they have not stopped.patient is to have a C-section. Told patient that she probably needs to be monitored and then checked if she is indeed having contractions. She is going to go to MAU to get checked.

## 2015-05-08 ENCOUNTER — Ambulatory Visit (INDEPENDENT_AMBULATORY_CARE_PROVIDER_SITE_OTHER): Payer: BLUE CROSS/BLUE SHIELD | Admitting: Obstetrics

## 2015-05-08 ENCOUNTER — Encounter: Payer: Self-pay | Admitting: Obstetrics

## 2015-05-08 VITALS — BP 106/63 | HR 89 | Temp 98.6°F | Wt 175.0 lb

## 2015-05-08 DIAGNOSIS — Z3493 Encounter for supervision of normal pregnancy, unspecified, third trimester: Secondary | ICD-10-CM

## 2015-05-08 LAB — POCT URINALYSIS DIPSTICK
BILIRUBIN UA: NEGATIVE
Blood, UA: NEGATIVE
GLUCOSE UA: NEGATIVE
KETONES UA: NEGATIVE
LEUKOCYTES UA: NEGATIVE
Nitrite, UA: NEGATIVE
SPEC GRAV UA: 1.015
Urobilinogen, UA: NEGATIVE
pH, UA: 6

## 2015-05-08 NOTE — Progress Notes (Signed)
Patient has no complaints. Felt contractions 2x last week

## 2015-05-08 NOTE — Progress Notes (Signed)
Subjective:    Jenny Randolph is a 25 y.o. female being seen today for her obstetrical visit. She is at 573w0d gestation. Patient reports no complaints. Fetal movement: normal.  Problem List Items Addressed This Visit    None    Visit Diagnoses    Prenatal care, third trimester    -  Primary    Relevant Orders    POCT urinalysis dipstick (Completed)      Patient Active Problem List   Diagnosis Date Noted  . Masses of both breasts 06/21/2014   Objective:    BP 106/63 mmHg  Pulse 89  Temp(Src) 98.6 F (37 C)  Wt 175 lb (79.379 kg)  LMP 09/29/2014 (Exact Date) FHT:  150 BPM  Uterine Size: size equals dates  Presentation: unsure     Assessment:    Pregnancy @ 623w0d weeks   Plan:     labs reviewed, problem list updated Consent signed. GBS sent TDAP offered  Rhogam given for RH negative Pediatrician: discussed. Infant feeding: plans to breastfeed. Maternity leave: discussed. Cigarette smoking: never smoked. Orders Placed This Encounter  Procedures  . POCT urinalysis dipstick   No orders of the defined types were placed in this encounter.   Follow up in 1 Week.

## 2015-05-18 ENCOUNTER — Encounter: Payer: BLUE CROSS/BLUE SHIELD | Admitting: Obstetrics

## 2015-05-20 ENCOUNTER — Inpatient Hospital Stay (HOSPITAL_COMMUNITY): Payer: BLUE CROSS/BLUE SHIELD

## 2015-05-20 ENCOUNTER — Inpatient Hospital Stay (HOSPITAL_COMMUNITY)
Admission: AD | Admit: 2015-05-20 | Discharge: 2015-05-20 | Disposition: A | Discharge status: Home / Self Care | Payer: BLUE CROSS/BLUE SHIELD | Source: Ambulatory Visit | Attending: Obstetrics | Admitting: Obstetrics

## 2015-05-20 ENCOUNTER — Encounter (HOSPITAL_COMMUNITY): Payer: Self-pay | Admitting: *Deleted

## 2015-05-20 DIAGNOSIS — R600 Localized edema: Secondary | ICD-10-CM

## 2015-05-20 DIAGNOSIS — O1203 Gestational edema, third trimester: Secondary | ICD-10-CM | POA: Insufficient documentation

## 2015-05-20 DIAGNOSIS — R0602 Shortness of breath: Secondary | ICD-10-CM | POA: Diagnosis not present

## 2015-05-20 DIAGNOSIS — Z3A37 37 weeks gestation of pregnancy: Secondary | ICD-10-CM | POA: Diagnosis not present

## 2015-05-20 LAB — URINALYSIS, ROUTINE W REFLEX MICROSCOPIC
Bilirubin Urine: NEGATIVE
Glucose, UA: NEGATIVE mg/dL
HGB URINE DIPSTICK: NEGATIVE
Ketones, ur: NEGATIVE mg/dL
Nitrite: NEGATIVE
Protein, ur: NEGATIVE mg/dL
SPECIFIC GRAVITY, URINE: 1.01 (ref 1.005–1.030)
pH: 6 (ref 5.0–8.0)

## 2015-05-20 LAB — URINE MICROSCOPIC-ADD ON: RBC / HPF: NONE SEEN RBC/hpf (ref 0–5)

## 2015-05-20 MED ORDER — IOPAMIDOL (ISOVUE-370) INJECTION 76%
100.0000 mL | Freq: Once | INTRAVENOUS | Status: AC | PRN
  Administered 2015-05-20: 100 mL via INTRAVENOUS

## 2015-05-20 NOTE — MAU Provider Note (Signed)
History     CSN: [id number]  Arrival date and time: 05/20/15 9147   First Provider Initiated Contact with Patient 05/20/15 1026      No chief complaint on file.  HPI   Ms.Jenny Randolph is a 25 y.o. is a 25 y.o. female [phone] at [redacted]w[redacted]d presenting with SOB, and increase swelling in both legs, left leg swelling > right.   She first noticed the swelling yesterday morning and started having shortness of breath last night.  She was not able to sleep last night due to the shortness of breath. This has never happened before, she has never had SOB before. She does not have a history of asthma, denies history of DVT, PE.   Patient went to work this morning and then had to leave because her shortness of breath was so hard. She feels like she cannot take a deep breath in.   + fetal movements Denies leaking or vaginal bleeding.   OB History    Gravida Para Term Preterm AB TAB SAB Ectopic Multiple Living   0 1 0 1 0 0 2      Past Medical History  Diagnosis Date  . Complication of anesthesia     Hives, Rash, Itching  . Medical history non-contributory     Past Surgical History  Procedure Laterality Date  . Cesarean section    . Dilation and curettage of uterus      Family History  Problem Relation Age of Onset  . Diabetes Mother   . Stroke Father   . Diabetes Maternal Grandmother   . Hyperlipidemia Maternal Grandmother   . Diabetes Maternal Grandfather     Social History  Substance Use Topics  . Smoking status: Never Smoker   . Smokeless tobacco: Never Used  . Alcohol Use: No    Allergies:  Allergies  Allergen Reactions  . Penicillins Hives    The whole -cillin family.  Has patient had a PCN reaction causing immediate rash, facial/tongue/throat swelling, SOB or lightheadedness with hypotension: yes Has patient had a PCN reaction causing severe rash involving mucus membranes or skin necrosis: unknown Has patient had a PCN reaction that required  hospitalization pt was in hospital when reaction occured Has patient had a PCN reaction occurring within the last 10 years: No If all of the above answers are "NO", then may proceed with Cephalospo    Prescriptions prior to admission  Medication Sig Dispense Refill Last Dose  . Prenatal Vit-Fe Fumarate-FA (PNV PRENATAL PLUS MULTIVITAMIN) 27-1 MG TABS Take 1 tablet by mouth daily before breakfast. 30 tablet 11 05/19/2015 at Unknown time  . ranitidine (ZANTAC) 150 MG tablet Take 1 tablet (150 mg total) by mouth 2 (two) times daily. 60 tablet 5 05/19/2015 at Unknown time   Results for orders placed or performed during the hospital encounter of 05/20/15 (from the past 72 hour(s))  Urinalysis, Routine w reflex microscopic (not at [facility])     Status: Abnormal   Collection Time: 05/20/15  9:30 AM  Result Value Ref Range   Color, Urine YELLOW YELLOW   APPearance CLEAR CLEAR   Specific Gravity, Urine 1.010 1.005 - 1.030   pH 6.0 5.0 - 8.0   Glucose, UA NEGATIVE NEGATIVE mg/dL   Hgb urine dipstick NEGATIVE NEGATIVE   Bilirubin Urine NEGATIVE NEGATIVE   Ketones, ur NEGATIVE NEGATIVE mg/dL   Protein, ur NEGATIVE NEGATIVE mg/dL   Nitrite NEGATIVE NEGATIVE   Leukocytes, UA TRACE (A) NEGATIVE  Urine microscopic-add  on     Status: Abnormal   Collection Time: 05/20/15  9:30 AM  Result Value Ref Range   Squamous Epithelial / LPF 0-5 (A) NONE SEEN   WBC, UA 0-5 0 - 5 WBC/hpf   RBC / HPF NONE SEEN 0 - 5 RBC/hpf   Bacteria, UA FEW (A) NONE SEEN    Ct Angio Chest Pe W/cm &/or Wo Cm  05/20/2015  CLINICAL DATA:  25 year old 8037 week pregnant female with lower extremity swelling and shortness of breath. Evaluate for pulmonary embolus. EXAM: CT ANGIOGRAPHY CHEST WITH CONTRAST TECHNIQUE: Multidetector CT imaging of the chest was performed using the standard protocol during bolus administration of intravenous contrast. Multiplanar CT image reconstructions and MIPs were obtained to evaluate the vascular anatomy.  CONTRAST:  100 mL Isovue 370 COMPARISON:  None. FINDINGS: Mediastinum: Unremarkable CT appearance of the thyroid gland. No suspicious mediastinal or hilar adenopathy. No soft tissue mediastinal mass. The thoracic esophagus is unremarkable. Heart/Vascular: Adequate opacification of the pulmonary arteries to the segmental level. No evidence of acute central filling defect to suggest pulmonary embolus. Normal caliber thoracic aorta. The heart is normal in size. No pericardial effusion. Lungs/Pleura: The lungs are clear.  No pleural effusion. Bones/Soft Tissues: No acute fracture or aggressive appearing lytic or blastic osseous lesion. Upper Abdomen: Visualized upper abdominal organs are unremarkable. Review of the MIP images confirms the above findings. IMPRESSION: No evidence of acute pulmonary embolus, pneumonia or other acute cardiopulmonary process. Electronically Signed   By: Malachy MoanHeath  McCullough M.D.   On: 05/20/2015 11:33   Review of Systems  Constitutional: Negative for fever and chills.  HENT: Negative for congestion and sore throat.   Eyes: Negative for blurred vision.  Respiratory: Positive for shortness of breath. Negative for cough, hemoptysis, sputum production and wheezing.   Cardiovascular: Positive for leg swelling. Negative for chest pain.  Gastrointestinal: Negative for heartburn, nausea, vomiting and abdominal pain.  Genitourinary: Negative for dysuria, urgency, frequency and hematuria.  Neurological: Negative for dizziness and headaches.   Physical Exam   Blood pressure 111/67, pulse 82, resp. rate 18, last menstrual period 09/29/2014, SpO2 100 %.  Physical Exam  Constitutional: She is oriented to person, place, and time. Vital signs are normal. She appears well-developed and well-nourished.  Non-toxic appearance. She does not have a sickly appearance. She does not appear ill. No distress.  HENT:  Head: Normocephalic.  Eyes: Pupils are equal, round, and reactive to light.   Cardiovascular: Normal rate and normal heart sounds.   Respiratory: Effort normal and breath sounds normal. No respiratory distress. She has no wheezes. She has no rales. She exhibits no tenderness.  GI: Soft.  Musculoskeletal: Normal range of motion. She exhibits no edema or tenderness.  Bilateral calf's measured and equal. 16 inches bilaterally. No pitting edema noted. Non tender on exam.   Neurological: She is alert and oriented to person, place, and time.  Skin: Skin is warm. She is not diaphoretic.  Psychiatric: Her behavior is normal.    Fetal Tracing: Baseline: 125 bpm  Variability: Moderate  Accelerations: 15x15 Decelerations: None Toco: One contraction with UI    MAU Course  Procedures  None  MDM  CT to evaluated for pulmonary edema/ PE Patient denies pain at this time  Patient 100% on room air   Assessment and Plan   A:  1. Bilateral edema of lower extremity   2. Shortness of breath at rest     P:  Discharge home in stable condition Return  to MAU if symptoms worsen Alternate foot elevation/ moving around Encouraged TED hose as needed.  Follow up with OB on Monday as scheduled Fetal kick counts Labor precautions.    Duane Lope, NP 05/20/2015 10:32 AM

## 2015-05-20 NOTE — MAU Note (Signed)
Pt presents to MAU with complaints of feet swelling and not being able to catch her breath at times. Denies any LOF or vaginal bleeding

## 2015-05-20 NOTE — Discharge Instructions (Signed)
Peripheral Edema You have swelling in your legs (peripheral edema). This swelling is due to excess accumulation of salt and water in your body. Edema may be a sign of heart, kidney or liver disease, or a side effect of a medication. It may also be due to problems in the leg veins. Elevating your legs and using special support stockings may be very helpful, if the cause of the swelling is due to poor venous circulation. Avoid long periods of standing, whatever the cause. Treatment of edema depends on identifying the cause. Chips, pretzels, pickles and other salty foods should be avoided. Restricting salt in your diet is almost always needed. Water pills (diuretics) are often used to remove the excess salt and water from your body via urine. These medicines prevent the kidney from reabsorbing sodium. This increases urine flow. Diuretic treatment may also result in lowering of potassium levels in your body. Potassium supplements may be needed if you have to use diuretics daily. Daily weights can help you keep track of your progress in clearing your edema. You should call your caregiver for follow up care as recommended. SEEK IMMEDIATE MEDICAL CARE IF:   You have increased swelling, pain, redness, or heat in your legs.  You develop shortness of breath, especially when lying down.  You develop chest or abdominal pain, weakness, or fainting.  You have a fever.   This information is not intended to replace advice given to you by your health care provider. Make sure you discuss any questions you have with your health care provider.   Document Released: 02/22/2004 Document Revised: 04/08/2011 Document Reviewed: 07/27/2014 Elsevier Interactive Patient Education [employer] Third Trimester of Pregnancy The third trimester is from week 29 through week 42, months 7 through 9. The third trimester is a time when the fetus is growing rapidly. At the end of the ninth month, the fetus is about 20 inches in  length and weighs 6-10 pounds.  BODY CHANGES Your body goes through many changes during pregnancy. The changes vary from woman to woman.   Your weight will continue to increase. You can expect to gain 25-35 pounds (11-16 kg) by the end of the pregnancy.  You may begin to get stretch marks on your hips, abdomen, and breasts.  You may urinate more often because the fetus is moving lower into your pelvis and pressing on your bladder.  You may develop or continue to have heartburn as a result of your pregnancy.  You may develop constipation because certain hormones are causing the muscles that push waste through your intestines to slow down.  You may develop hemorrhoids or swollen, bulging veins (varicose veins).  You may have pelvic pain because of the weight gain and pregnancy hormones relaxing your joints between the bones in your pelvis. Backaches may result from overexertion of the muscles supporting your posture.  You may have changes in your hair. These can include thickening of your hair, rapid growth, and changes in texture. Some women also have hair loss during or after pregnancy, or hair that feels dry or thin. Your hair will most likely return to normal after your baby is born.  Your breasts will continue to grow and be tender. A yellow discharge may leak from your breasts called colostrum.  Your belly button may stick out.  You may feel short of breath because of your expanding uterus.  You may notice the fetus "dropping," or moving lower in your abdomen.  You may have a bloody mucus  discharge. This usually occurs a few days to a week before labor begins.  Your cervix becomes thin and soft (effaced) near your due date. WHAT TO EXPECT AT YOUR PRENATAL EXAMS  You will have prenatal exams every 2 weeks until week 36. Then, you will have weekly prenatal exams. During a routine prenatal visit:  You will be weighed to make sure you and the fetus are growing normally.  Your  blood pressure is taken.  Your abdomen will be measured to track your baby's growth.  The fetal heartbeat will be listened to.  Any test results from the previous visit will be discussed.  You may have a cervical check near your due date to see if you have effaced. At around 36 weeks, your caregiver will check your cervix. At the same time, your caregiver will also perform a test on the secretions of the vaginal tissue. This test is to determine if a type of bacteria, Group B streptococcus, is present. Your caregiver will explain this further. Your caregiver may ask you:  What your birth plan is.  How you are feeling.  If you are feeling the baby move.  If you have had any abnormal symptoms, such as leaking fluid, bleeding, severe headaches, or abdominal cramping.  If you are using any tobacco products, including cigarettes, chewing tobacco, and electronic cigarettes.  If you have any questions. Other tests or screenings that may be performed during your third trimester include:  Blood tests that check for low iron levels (anemia).  Fetal testing to check the health, activity level, and growth of the fetus. Testing is done if you have certain medical conditions or if there are problems during the pregnancy.  HIV (human immunodeficiency virus) testing. If you are at high risk, you may be screened for HIV during your third trimester of pregnancy. FALSE LABOR You may feel small, irregular contractions that eventually go away. These are called Braxton Hicks contractions, or false labor. Contractions may last for hours, days, or even weeks before true labor sets in. If contractions come at regular intervals, intensify, or become painful, it is best to be seen by your caregiver.  SIGNS OF LABOR   Menstrual-like cramps.  Contractions that are 5 minutes apart or less.  Contractions that start on the top of the uterus and spread down to the lower abdomen and back.  A sense of increased  pelvic pressure or back pain.  A watery or bloody mucus discharge that comes from the vagina. If you have any of these signs before the 37th week of pregnancy, call your caregiver right away. You need to go to the hospital to get checked immediately. HOME CARE INSTRUCTIONS   Avoid all smoking, herbs, alcohol, and unprescribed drugs. These chemicals affect the formation and growth of the baby.  Do not use any tobacco products, including cigarettes, chewing tobacco, and electronic cigarettes. If you need help quitting, ask your health care provider. You may receive counseling support and other resources to help you quit.  Follow your caregiver's instructions regarding medicine use. There are medicines that are either safe or unsafe to take during pregnancy.  Exercise only as directed by your caregiver. Experiencing uterine cramps is a good sign to stop exercising.  Continue to eat regular, healthy meals.  Wear a good support bra for breast tenderness.  Do not use hot tubs, steam rooms, or saunas.  Wear your seat belt at all times when driving.  Avoid raw meat, uncooked cheese, cat litter boxes,  and soil used by cats. These carry germs that can cause birth defects in the baby.  Take your prenatal vitamins.  Take 1500-2000 mg of calcium daily starting at the 20th week of pregnancy until you deliver your baby.  Try taking a stool softener (if your caregiver approves) if you develop constipation. Eat more high-fiber foods, such as fresh vegetables or fruit and whole grains. Drink plenty of fluids to keep your urine clear or pale yellow.  Take warm sitz baths to soothe any pain or discomfort caused by hemorrhoids. Use hemorrhoid cream if your caregiver approves.  If you develop varicose veins, wear support hose. Elevate your feet for 15 minutes, 3-4 times a day. Limit salt in your diet.  Avoid heavy lifting, wear low heal shoes, and practice good posture.  Rest a lot with your legs  elevated if you have leg cramps or low back pain.  Visit your dentist if you have not gone during your pregnancy. Use a soft toothbrush to brush your teeth and be gentle when you floss.  A sexual relationship may be continued unless your caregiver directs you otherwise.  Do not travel far distances unless it is absolutely necessary and only with the approval of your caregiver.  Take prenatal classes to understand, practice, and ask questions about the labor and delivery.  Make a trial run to the hospital.  Pack your hospital bag.  Prepare the baby's nursery.  Continue to go to all your prenatal visits as directed by your caregiver. SEEK MEDICAL CARE IF:  You are unsure if you are in labor or if your water has broken.  You have dizziness.  You have mild pelvic cramps, pelvic pressure, or nagging pain in your abdominal area.  You have persistent nausea, vomiting, or diarrhea.  You have a bad smelling vaginal discharge.  You have pain with urination. SEEK IMMEDIATE MEDICAL CARE IF:   You have a fever.  You are leaking fluid from your vagina.  You have spotting or bleeding from your vagina.  You have severe abdominal cramping or pain.  You have rapid weight loss or gain.  You have shortness of breath with chest pain.  You notice sudden or extreme swelling of your face, hands, ankles, feet, or legs.  You have not felt your baby move in over an hour.  You have severe headaches that do not go away with medicine.  You have vision changes.   This information is not intended to replace advice given to you by your health care provider. Make sure you discuss any questions you have with your health care provider.   Document Released: 01/08/2001 Document Revised: 02/04/2014 Document Reviewed: 03/17/2012 Elsevier Interactive Patient Education [employer] Inc[employer]

## 2015-05-22 ENCOUNTER — Ambulatory Visit (INDEPENDENT_AMBULATORY_CARE_PROVIDER_SITE_OTHER): Payer: BLUE CROSS/BLUE SHIELD | Admitting: Obstetrics

## 2015-05-22 ENCOUNTER — Encounter: Payer: Self-pay | Admitting: Obstetrics

## 2015-05-22 VITALS — BP 117/70 | HR 83 | Temp 98.6°F | Wt 184.0 lb

## 2015-05-22 DIAGNOSIS — Z3493 Encounter for supervision of normal pregnancy, unspecified, third trimester: Secondary | ICD-10-CM

## 2015-05-22 LAB — POCT URINALYSIS DIPSTICK
Bilirubin, UA: NEGATIVE
Blood, UA: NEGATIVE
Glucose, UA: NEGATIVE
Ketones, UA: NEGATIVE
Leukocytes, UA: NEGATIVE
Nitrite, UA: NEGATIVE
Protein, UA: NEGATIVE
Spec Grav, UA: 1.015
Urobilinogen, UA: NEGATIVE
pH, UA: 7

## 2015-05-22 NOTE — Progress Notes (Signed)
Subjective:    Jenny Randolph is a 25 y.o. female being seen today for her obstetrical visit. She is at 7676w0d gestation. Patient reports no complaints. Fetal movement: normal.  Problem List Items Addressed This Visit    None    Visit Diagnoses    Prenatal care, third trimester    -  Primary    Relevant Orders    POCT urinalysis dipstick (Completed)      Patient Active Problem List   Diagnosis Date Noted  . Masses of both breasts 06/21/2014    Objective:    BP 117/70 mmHg  Pulse 83  Temp(Src) 98.6 F (37 C)  Wt 184 lb (83.462 kg)  LMP 09/29/2014 (Exact Date) FHT: 150 BPM  Uterine Size: size equals dates  Presentations: unsure    Assessment:    Pregnancy @ 7776w0d weeks   Plan:   Plans for delivery: C/Section scheduled; labs reviewed; problem list updated Counseling: Consent signed. Infant feeding: plans to breastfeed. Cigarette smoking: never smoked. L&D discussion: symptoms of labor, discussed when to call, discussed what number to call, anesthetic/analgesic options reviewed and delivering clinician:  plans Physician. Postpartum supports and preparation: circumcision discussed and contraception plans discussed.  Follow up in 1 Week.

## 2015-05-22 NOTE — Progress Notes (Signed)
Patient has been up all night with strong back pain & pressure

## 2015-05-26 ENCOUNTER — Telehealth (HOSPITAL_COMMUNITY): Payer: Self-pay | Admitting: *Deleted

## 2015-05-26 NOTE — Telephone Encounter (Signed)
Preadmission screen  

## 2015-05-29 ENCOUNTER — Encounter: Payer: Self-pay | Admitting: *Deleted

## 2015-05-29 ENCOUNTER — Ambulatory Visit (INDEPENDENT_AMBULATORY_CARE_PROVIDER_SITE_OTHER): Payer: BLUE CROSS/BLUE SHIELD | Admitting: Obstetrics

## 2015-05-29 ENCOUNTER — Encounter (HOSPITAL_COMMUNITY): Payer: Self-pay | Admitting: *Deleted

## 2015-05-29 ENCOUNTER — Telehealth (HOSPITAL_COMMUNITY): Payer: Self-pay | Admitting: *Deleted

## 2015-05-29 VITALS — BP 119/74 | HR 97 | Temp 98.6°F | Wt 185.0 lb

## 2015-05-29 DIAGNOSIS — Z3493 Encounter for supervision of normal pregnancy, unspecified, third trimester: Secondary | ICD-10-CM

## 2015-05-29 LAB — POCT URINALYSIS DIPSTICK
Bilirubin, UA: NEGATIVE
Glucose, UA: NEGATIVE
Ketones, UA: NEGATIVE
Leukocytes, UA: NEGATIVE
Nitrite, UA: NEGATIVE
PROTEIN UA: NEGATIVE
RBC UA: NEGATIVE
SPEC GRAV UA: 1.01
UROBILINOGEN UA: NEGATIVE
pH, UA: 8

## 2015-05-29 NOTE — Progress Notes (Signed)
Patient is ready for birth on Friday.

## 2015-05-29 NOTE — Telephone Encounter (Signed)
Preadmission screen  

## 2015-06-01 ENCOUNTER — Encounter (HOSPITAL_COMMUNITY)
Admission: RE | Admit: 2015-06-01 | Discharge: 2015-06-01 | Disposition: A | Discharge status: Home / Self Care | Payer: BLUE CROSS/BLUE SHIELD | Source: Ambulatory Visit | Attending: Obstetrics | Admitting: Obstetrics

## 2015-06-01 ENCOUNTER — Encounter (HOSPITAL_COMMUNITY): Payer: Self-pay | Admitting: Anesthesiology

## 2015-06-01 LAB — ABO/RH: ABO/RH(D): O POS

## 2015-06-01 LAB — CBC
HCT: 34.5 % — ABNORMAL LOW (ref 36.0–46.0)
HEMOGLOBIN: 11.8 g/dL — AB (ref 12.0–15.0)
MCH: 30.1 pg (ref 26.0–34.0)
MCHC: 34.2 g/dL (ref 30.0–36.0)
MCV: 88 fL (ref 78.0–100.0)
Platelets: 193 10*3/uL (ref 150–400)
RBC: 3.92 MIL/uL (ref 3.87–5.11)
RDW: 13.8 % (ref 11.5–15.5)
WBC: 8.4 10*3/uL (ref 4.0–10.5)

## 2015-06-01 LAB — TYPE AND SCREEN
ABO/RH(D): O POS
ANTIBODY SCREEN: NEGATIVE

## 2015-06-01 NOTE — Anesthesia Preprocedure Evaluation (Addendum)
Anesthesia Evaluation  Patient identified by MRN, date of birth, ID band Patient awake    Reviewed: Allergy & Precautions, NPO status , Patient's Chart, lab work & pertinent test results  History of Anesthesia Complications (+) PONV and history of anesthetic complications  Airway Mallampati: II  TM Distance: >3 FB Neck ROM: Full    Dental no notable dental hx. (+) Teeth Intact, Chipped,    Pulmonary neg pulmonary ROS,    Pulmonary exam normal breath sounds clear to auscultation       Cardiovascular negative cardio ROS Normal cardiovascular exam Rhythm:Regular Rate:Normal     Neuro/Psych negative neurological ROS  negative psych ROS   GI/Hepatic Neg liver ROS, GERD  Medicated and Controlled,  Endo/Other  Obesity   Renal/GU negative Renal ROS  negative genitourinary   Musculoskeletal negative musculoskeletal ROS (+)   Abdominal (+) + obese,   Peds  Hematology  (+) anemia ,   Anesthesia Other Findings   Reproductive/Obstetrics (+) Pregnancy Previous C/Section                             Anesthesia Physical Anesthesia Plan  ASA: II  Anesthesia Plan: Spinal   Post-op Pain Management:    Induction:   Airway Management Planned: Natural Airway  Additional Equipment:   Intra-op Plan:   Post-operative Plan: Extubation in OR  Informed Consent: I have reviewed the patients History and Physical, chart, labs and discussed the procedure including the risks, benefits and alternatives for the proposed anesthesia with the patient or authorized representative who has indicated his/her understanding and acceptance.   Dental advisory given  Plan Discussed with: Anesthesiologist, CRNA and Surgeon  Anesthesia Plan Comments:         Anesthesia Quick Evaluation

## 2015-06-01 NOTE — Patient Instructions (Signed)
20 Jenny Randolph  06/01/2015   Your procedure is scheduled on:  06/02/2015  Enter through the Main Entrance of [facility] at 0700 AM.  Pick up the phone at the desk and dial [phone].   Call this number if you have problems the morning of surgery: [phone]   Remember:   Do not eat food:After Midnight.  Do not drink clear liquids: After Midnight.  Take these medicines the morning of surgery with A SIP OF WATER: none   Do not wear jewelry, make-up or nail polish.  Do not wear lotions, powders, or perfumes. You may wear deodorant.  Do not shave 48 hours prior to surgery.  Do not bring valuables to the hospital.  [facility]   responsible for any belongings or valuables brought to the hospital.  Contacts, dentures or bridgework may not be worn into surgery.  Leave suitcase in the car. After surgery it may be brought to your room.  For patients admitted to the hospital, checkout time is 11:00 AM the day of              discharge.   Patients discharged the day of surgery will not be allowed to drive             home.  Name and phone number of your driver: na  Special Instructions:   Shower using CHG 2 nights before surgery and the night before surgery.  If you shower the day of surgery use CHG.  Use special wash - you have one bottle of CHG for all showers.  You should use approximately 1/3 of the bottle for each shower.   Please read over the following fact sheets that you were given:   Surgical Site Infection Prevention

## 2015-06-02 ENCOUNTER — Inpatient Hospital Stay (HOSPITAL_COMMUNITY): Payer: BLUE CROSS/BLUE SHIELD | Admitting: Anesthesiology

## 2015-06-02 ENCOUNTER — Inpatient Hospital Stay (HOSPITAL_COMMUNITY)
Admission: AD | Admit: 2015-06-02 | Discharge: 2015-06-05 | DRG: 766 | Disposition: A | Discharge status: Home / Self Care | Payer: BLUE CROSS/BLUE SHIELD | Source: Ambulatory Visit | Attending: Obstetrics | Admitting: Obstetrics

## 2015-06-02 ENCOUNTER — Encounter (HOSPITAL_COMMUNITY): Payer: Self-pay

## 2015-06-02 ENCOUNTER — Encounter (HOSPITAL_COMMUNITY)
Admission: AD | Disposition: A | Discharge status: Home / Self Care | Payer: Self-pay | Source: Ambulatory Visit | Attending: Obstetrics

## 2015-06-02 DIAGNOSIS — Z823 Family history of stroke: Secondary | ICD-10-CM | POA: Diagnosis not present

## 2015-06-02 DIAGNOSIS — O34219 Maternal care for unspecified type scar from previous cesarean delivery: Secondary | ICD-10-CM | POA: Diagnosis present

## 2015-06-02 DIAGNOSIS — Z833 Family history of diabetes mellitus: Secondary | ICD-10-CM | POA: Diagnosis not present

## 2015-06-02 DIAGNOSIS — O34211 Maternal care for low transverse scar from previous cesarean delivery: Principal | ICD-10-CM | POA: Diagnosis present

## 2015-06-02 DIAGNOSIS — O909 Complication of the puerperium, unspecified: Secondary | ICD-10-CM | POA: Diagnosis present

## 2015-06-02 DIAGNOSIS — Z3A39 39 weeks gestation of pregnancy: Secondary | ICD-10-CM | POA: Diagnosis not present

## 2015-06-02 LAB — RPR: RPR: NONREACTIVE

## 2015-06-02 SURGERY — Surgical Case
Anesthesia: Spinal

## 2015-06-02 MED ORDER — OXYCODONE-ACETAMINOPHEN 5-325 MG PO TABS
1.0000 | ORAL_TABLET | ORAL | Status: DC | PRN
  Administered 2015-06-02 – 2015-06-05 (×5): 1 via ORAL
  Filled 2015-06-02 (×7): qty 1

## 2015-06-02 MED ORDER — OXYCODONE-ACETAMINOPHEN 5-325 MG PO TABS
2.0000 | ORAL_TABLET | ORAL | Status: DC | PRN
  Administered 2015-06-03 – 2015-06-04 (×4): 2 via ORAL
  Filled 2015-06-02 (×3): qty 2

## 2015-06-02 MED ORDER — GENTAMICIN SULFATE 40 MG/ML IJ SOLN: 5.0000 mg/kg | Freq: Once | INTRAVENOUS | Status: DC

## 2015-06-02 MED ORDER — SIMETHICONE 80 MG PO CHEW
80.0000 mg | CHEWABLE_TABLET | Freq: Three times a day (TID) | ORAL | Status: DC
  Administered 2015-06-03 – 2015-06-05 (×8): 80 mg via ORAL
  Filled 2015-06-02 (×10): qty 1

## 2015-06-02 MED ORDER — DIPHENHYDRAMINE HCL 50 MG/ML IJ SOLN
INTRAMUSCULAR | Status: AC
  Filled 2015-06-02: qty 1

## 2015-06-02 MED ORDER — SENNOSIDES-DOCUSATE SODIUM 8.6-50 MG PO TABS
2.0000 | ORAL_TABLET | ORAL | Status: DC
  Administered 2015-06-03 – 2015-06-04 (×3): 2 via ORAL
  Filled 2015-06-02 (×3): qty 2

## 2015-06-02 MED ORDER — SCOPOLAMINE 1 MG/3DAYS TD PT72
MEDICATED_PATCH | TRANSDERMAL | Status: AC
  Administered 2015-06-02: 1.5 mg via TRANSDERMAL
  Filled 2015-06-02: qty 1

## 2015-06-02 MED ORDER — MENTHOL 3 MG MT LOZG: 1.0000 | LOZENGE | OROMUCOSAL | Status: DC | PRN

## 2015-06-02 MED ORDER — OXYTOCIN 10 UNIT/ML IJ SOLN: 2.5000 [IU]/h | INTRAVENOUS | Status: AC

## 2015-06-02 MED ORDER — CLINDAMYCIN PHOSPHATE 900 MG/50ML IV SOLN: 900.0000 mg | Freq: Once | INTRAVENOUS | Status: DC

## 2015-06-02 MED ORDER — DIPHENHYDRAMINE HCL 25 MG PO CAPS
25.0000 mg | ORAL_CAPSULE | Freq: Four times a day (QID) | ORAL | Status: DC | PRN
  Administered 2015-06-02: 25 mg via ORAL
  Filled 2015-06-02 (×2): qty 1

## 2015-06-02 MED ORDER — ZOLPIDEM TARTRATE 5 MG PO TABS: 5.0000 mg | ORAL_TABLET | Freq: Every evening | ORAL | Status: DC | PRN

## 2015-06-02 MED ORDER — OXYTOCIN 10 UNIT/ML IJ SOLN
INTRAMUSCULAR | Status: AC
  Filled 2015-06-02: qty 4

## 2015-06-02 MED ORDER — SIMETHICONE 80 MG PO CHEW
80.0000 mg | CHEWABLE_TABLET | ORAL | Status: DC
  Administered 2015-06-03 – 2015-06-04 (×3): 80 mg via ORAL
  Filled 2015-06-02 (×3): qty 1

## 2015-06-02 MED ORDER — ONDANSETRON HCL 4 MG/2ML IJ SOLN
INTRAMUSCULAR | Status: DC | PRN
  Administered 2015-06-02: 4 mg via INTRAVENOUS

## 2015-06-02 MED ORDER — ACETAMINOPHEN 325 MG PO TABS: 650.0000 mg | ORAL_TABLET | ORAL | Status: DC | PRN

## 2015-06-02 MED ORDER — KETOROLAC TROMETHAMINE 30 MG/ML IJ SOLN
INTRAMUSCULAR | Status: AC
  Administered 2015-06-02: 30 mg
  Filled 2015-06-02: qty 1

## 2015-06-02 MED ORDER — DEXTROSE 5 % IV SOLN
Freq: Once | INTRAVENOUS | Status: AC
  Administered 2015-06-02: 113.5 mL via INTRAVENOUS
  Filled 2015-06-02: qty 7.75

## 2015-06-02 MED ORDER — TETANUS-DIPHTH-ACELL PERTUSSIS 5-2.5-18.5 LF-MCG/0.5 IM SUSP
0.5000 mL | Freq: Once | INTRAMUSCULAR | Status: AC
  Administered 2015-06-03: 0.5 mL via INTRAMUSCULAR
  Filled 2015-06-02: qty 0.5

## 2015-06-02 MED ORDER — LACTATED RINGERS IV SOLN: Freq: Once | INTRAVENOUS | Status: DC

## 2015-06-02 MED ORDER — SIMETHICONE 80 MG PO CHEW: 80.0000 mg | CHEWABLE_TABLET | ORAL | Status: DC | PRN

## 2015-06-02 MED ORDER — FENTANYL CITRATE (PF) 100 MCG/2ML IJ SOLN
INTRAMUSCULAR | Status: AC
  Filled 2015-06-02: qty 2

## 2015-06-02 MED ORDER — ONDANSETRON HCL 4 MG/2ML IJ SOLN
INTRAMUSCULAR | Status: AC
  Filled 2015-06-02: qty 2

## 2015-06-02 MED ORDER — LACTATED RINGERS IV SOLN
INTRAVENOUS | Status: DC
Start: 2015-06-02 — End: 2015-06-02
  Administered 2015-06-02 (×3): via INTRAVENOUS

## 2015-06-02 MED ORDER — DIPHENHYDRAMINE HCL 50 MG/ML IJ SOLN
INTRAMUSCULAR | Status: DC | PRN
  Administered 2015-06-02: 25 mg via INTRAVENOUS

## 2015-06-02 MED ORDER — MORPHINE SULFATE (PF) 0.5 MG/ML IJ SOLN
INTRAMUSCULAR | Status: DC | PRN
  Administered 2015-06-02: .1 mg via INTRATHECAL

## 2015-06-02 MED ORDER — SCOPOLAMINE 1 MG/3DAYS TD PT72
1.0000 | MEDICATED_PATCH | Freq: Once | TRANSDERMAL | Status: DC
  Administered 2015-06-02: 1.5 mg via TRANSDERMAL

## 2015-06-02 MED ORDER — FENTANYL CITRATE (PF) 100 MCG/2ML IJ SOLN
25.0000 ug | INTRAMUSCULAR | Status: DC | PRN
  Administered 2015-06-02 (×4): 25 ug via INTRAVENOUS

## 2015-06-02 MED ORDER — PHENYLEPHRINE 8 MG IN D5W 100 ML (0.08MG/ML) PREMIX OPTIME
INJECTION | INTRAVENOUS | Status: AC
  Filled 2015-06-02: qty 100

## 2015-06-02 MED ORDER — PHENYLEPHRINE 8 MG IN D5W 100 ML (0.08MG/ML) PREMIX OPTIME
INJECTION | INTRAVENOUS | Status: DC | PRN
  Administered 2015-06-02: 60 ug/min via INTRAVENOUS

## 2015-06-02 MED ORDER — OXYTOCIN 10 UNIT/ML IJ SOLN
40.0000 [IU] | INTRAVENOUS | Status: DC | PRN
  Administered 2015-06-02: 40 [IU] via INTRAVENOUS

## 2015-06-02 MED ORDER — DIBUCAINE 1 % RE OINT: 1.0000 "application " | TOPICAL_OINTMENT | RECTAL | Status: DC | PRN

## 2015-06-02 MED ORDER — BUPIVACAINE IN DEXTROSE 0.75-8.25 % IT SOLN
INTRATHECAL | Status: DC | PRN
  Administered 2015-06-02: 10.5 mg via INTRATHECAL

## 2015-06-02 MED ORDER — MORPHINE SULFATE (PF) 0.5 MG/ML IJ SOLN
INTRAMUSCULAR | Status: AC
  Filled 2015-06-02: qty 10

## 2015-06-02 MED ORDER — PRENATAL MULTIVITAMIN CH
1.0000 | ORAL_TABLET | Freq: Every day | ORAL | Status: DC
  Administered 2015-06-03 – 2015-06-05 (×3): 1 via ORAL
  Filled 2015-06-02 (×3): qty 1

## 2015-06-02 MED ORDER — LACTATED RINGERS IV SOLN
INTRAVENOUS | Status: DC
  Administered 2015-06-02 (×2): via INTRAVENOUS

## 2015-06-02 MED ORDER — WITCH HAZEL-GLYCERIN EX PADS
1.0000 | MEDICATED_PAD | CUTANEOUS | Status: DC | PRN
Start: 2015-06-02 — End: 2015-06-05

## 2015-06-02 MED ORDER — FENTANYL CITRATE (PF) 100 MCG/2ML IJ SOLN
INTRAMUSCULAR | Status: DC | PRN
  Administered 2015-06-02: 20 ug via INTRATHECAL

## 2015-06-02 MED ORDER — COCONUT OIL OIL: 1.0000 "application " | TOPICAL_OIL | Status: DC | PRN

## 2015-06-02 MED ORDER — IBUPROFEN 600 MG PO TABS
600.0000 mg | ORAL_TABLET | Freq: Four times a day (QID) | ORAL | Status: DC
  Administered 2015-06-03 – 2015-06-05 (×11): 600 mg via ORAL
  Filled 2015-06-02 (×12): qty 1

## 2015-06-02 SURGICAL SUPPLY — 35 items
CLAMP CORD UMBIL (MISCELLANEOUS) IMPLANT
CLOTH BEACON ORANGE TIMEOUT ST (SAFETY) ×3 IMPLANT
CONTAINER PREFILL 10% NBF 15ML (MISCELLANEOUS) ×6 IMPLANT
DRSG OPSITE POSTOP 4X10 (GAUZE/BANDAGES/DRESSINGS) ×3 IMPLANT
DURAPREP 26ML APPLICATOR (WOUND CARE) ×3 IMPLANT
ELECT REM PT RETURN 9FT ADLT (ELECTROSURGICAL) ×3
ELECTRODE REM PT RTRN 9FT ADLT (ELECTROSURGICAL) ×1 IMPLANT
EXTRACTOR VACUUM M CUP 4 TUBE (SUCTIONS) IMPLANT
EXTRACTOR VACUUM M CUP 4' TUBE (SUCTIONS)
GLOVE BIO SURGEON STRL SZ8 (GLOVE) ×3 IMPLANT
GLOVE BIOGEL PI IND STRL 7.0 (GLOVE) ×1 IMPLANT
GLOVE BIOGEL PI INDICATOR 7.0 (GLOVE) ×2
GOWN STRL REUS W/TWL LRG LVL3 (GOWN DISPOSABLE) ×6 IMPLANT
KIT ABG SYR 3ML LUER SLIP (SYRINGE) IMPLANT
LIQUID BAND (GAUZE/BANDAGES/DRESSINGS) ×3 IMPLANT
NEEDLE HYPO 22GX1.5 SAFETY (NEEDLE) ×3 IMPLANT
NEEDLE HYPO 25X5/8 SAFETYGLIDE (NEEDLE) ×3 IMPLANT
NS IRRIG 1000ML POUR BTL (IV SOLUTION) ×3 IMPLANT
PACK C SECTION WH (CUSTOM PROCEDURE TRAY) ×3 IMPLANT
PAD OB MATERNITY 4.3X12.25 (PERSONAL CARE ITEMS) ×3 IMPLANT
PENCIL SMOKE EVAC W/HOLSTER (ELECTROSURGICAL) ×3 IMPLANT
RTRCTR C-SECT PINK 25CM LRG (MISCELLANEOUS) ×3 IMPLANT
SUT GUT PLAIN 0 CT-3 TAN 27 (SUTURE) IMPLANT
SUT MNCRL 0 VIOLET CTX 36 (SUTURE) ×3 IMPLANT
SUT MNCRL AB 4-0 PS2 18 (SUTURE) IMPLANT
SUT MON AB 2-0 CT1 27 (SUTURE) ×3 IMPLANT
SUT MON AB 3-0 SH 27 (SUTURE)
SUT MON AB 3-0 SH27 (SUTURE) IMPLANT
SUT MONOCRYL 0 CTX 36 (SUTURE) ×6
SUT PLAIN 2 0 XLH (SUTURE) IMPLANT
SUT VIC AB 0 CTX 36 (SUTURE) ×4
SUT VIC AB 0 CTX36XBRD ANBCTRL (SUTURE) ×2 IMPLANT
SYR CONTROL 10ML LL (SYRINGE) ×3 IMPLANT
TOWEL OR 17X24 6PK STRL BLUE (TOWEL DISPOSABLE) ×3 IMPLANT
TRAY FOLEY CATH SILVER 14FR (SET/KITS/TRAYS/PACK) ×3 IMPLANT

## 2015-06-02 NOTE — Anesthesia Procedure Notes (Signed)
Spinal Patient location during procedure: OR Start time: 06/02/2015 8:27 AM Staffing Anesthesiologist: Mal AmabileFOSTER, Jenny Poblete Performed by: anesthesiologist  Preanesthetic Checklist Completed: patient identified, site marked, surgical consent, pre-op evaluation, timeout performed, IV checked, risks and benefits discussed and monitors and equipment checked Spinal Block Patient position: sitting Prep: site prepped and draped and DuraPrep Patient monitoring: heart rate, cardiac monitor, continuous pulse ox and blood pressure Approach: midline Location: L3-4 Injection technique: single-shot Needle Needle type: Pencan  Needle gauge: 24 G Needle length: 9 cm Needle insertion depth: 6 cm Assessment Sensory level: T4 Additional Notes Patient tolerated procedure well. Adequate sensory level.

## 2015-06-02 NOTE — Transfer of Care (Signed)
Immediate Anesthesia Transfer of Care Note  Patient: Jenny Randolph  Procedure(s) Performed: Procedure(s): REPEAT CESAREAN SECTION (N/A)  Patient Location: PACU  Anesthesia Type:Spinal  Level of Consciousness: awake, alert  and oriented  Airway & Oxygen Therapy: Patient Spontanous Breathing  Post-op Assessment: Report given to RN  Post vital signs: Reviewed  Last Vitals:  Filed Vitals:   06/02/15 0716  BP: 113/81  Pulse: 87  Temp: 36.7 C  Resp: 18    Last Pain: There were no vitals filed for this visit.       Complications: No apparent anesthesia complications

## 2015-06-02 NOTE — Op Note (Addendum)
Cesarean Section Procedure Note   Jenny Randolph   06/02/2015  Indications: Scheduled Proceedure/Maternal Request   Pre-operative Diagnosis: REPEAT C/S.   Post-operative Diagnosis: Same   Surgeon: Cendy Oconnor A  Assistants: Surgical Technician  Anesthesia: spinal  Procedure:  Repeat LTCS  Procedure Details:  The patient was seen in the Holding Room. The risks, benefits, complications, treatment options, and expected outcomes were discussed with the patient. The patient concurred with the proposed plan, giving informed consent. The patient was identified as Jenny Randolph and the procedure verified as C-Section Delivery. A Time Out was held and the above information confirmed.  After induction of anesthesia, the patient was draped and prepped in the usual sterile manner. A transverse incision was made and carried down through the subcutaneous tissue to the fascia. The fascial incision was made and extended transversely. The fascia was separated from the underlying rectus tissue superiorly and inferiorly. The peritoneum was identified and entered. The peritoneal incision was extended longitudinally. The utero-vesical peritoneal reflection was incised transversely and the bladder flap was bluntly freed from the lower uterine segment. A low transverse uterine incision was made. Delivered from cephalic presentation was a 3340 gram living newborn female infant(s). APGAR (1 MIN): 9   APGAR (5 MINS): 9   APGAR (10 MINS):    A cord ph was not sent. The umbilical cord was clamped and cut cord. A sample was obtained for evaluation. The placenta was removed Intact and appeared normal.  The uterine incision was closed with running locked sutures of 0 Monocryl. A second imbricating layer of the same suture was placed.  Hemostasis was observed. The paracolic gutters were irrigated. The parieto peritoneum was closed in a running fashion with 2-0 Vicryl.  The fascia was then reapproximated with running  sutures of 0 Vicryl.  The skin was closed with staples.  Instrument, sponge, and needle counts were correct prior the abdominal closure and were correct at the conclusion of the case.    Findings:  Normal uterus, ovaries and tubes   Estimated Blood Loss:  200ml  Total IV Fluids: 2800ml   Urine Output: 200CC OF clear urine  Specimens: Placenta to L&D  Complications: no complications  Disposition: PACU - hemodynamically stable.  Maternal Condition: stable   Baby condition / location:  Couplet care / Skin to Skin    Signed: Surgeon(s): Brock Badharles A Kyani Simkin, MD

## 2015-06-02 NOTE — Addendum Note (Signed)
Addendum  created 06/02/15 1504 by Yolonda KidaAlison L Debanhi Blaker, CRNA   Modules edited: Clinical Notes   Clinical Notes:  File: [id number]

## 2015-06-02 NOTE — Lactation Note (Signed)
This note was copied from a baby's chart. Lactation Consultation Note Initial visit at 7 hours of age.  Mom reports a few good feedings.  Mom has 2 older children that didn't latch.  Baby is starting to show feeding cues in crib and LC offered to assist with latch.  LC instructed mom on how to do hand expression with a few drops expressed from right breast before attempting to latch in football hold.  Baby mouthing breast but too sleepy to suck.  Baby remains STS with mom.  [facility] resources given and discussed.  Encouraged to feed with early cues on demand.  Early newborn behavior discussed.  Mom to call for assist as needed.    Patient Name: Boy Janne NapoleonJocelyn Orzechowski ZOXWR'UToday's Date: 06/02/2015 Reason for consult: Initial assessment   Maternal Data Has patient been taught Hand Expression?: Yes Does the patient have breastfeeding experience prior to this delivery?: No (3rd child others didn't latch)  Feeding Feeding Type: Breast Fed Length of feed:  (too sleepy)  LATCH Score/Interventions Latch: Too sleepy or reluctant, no latch achieved, no sucking elicited. Intervention(s): Skin to skin;Teach feeding cues;Waking techniques  Audible Swallowing: None Intervention(s): Skin to skin;Hand expression  Type of Nipple: Everted at rest and after stimulation (center of nipple invertsn nipples flatten with compression)  Comfort (Breast/Nipple): Soft / non-tender     Hold (Positioning): Assistance needed to correctly position infant at breast and maintain latch. Intervention(s): Breastfeeding basics reviewed;Support Pillows;Position options;Skin to skin  LATCH Score: 5  Lactation Tools Discussed/Used WIC Program: Yes   Consult Status Consult Status: Follow-up Date: 06/03/15 Follow-up type: In-patient    Jannifer RodneyShoptaw, Jana Lynn 06/02/2015, 4:48 PM

## 2015-06-02 NOTE — Anesthesia Postprocedure Evaluation (Signed)
Anesthesia Post Note  Patient: Jenny Randolph  Procedure(s) Performed: Procedure(s) (LRB): REPEAT CESAREAN SECTION (N/A)  Patient location during evaluation: PACU Anesthesia Type: Spinal Level of consciousness: awake and alert and oriented Pain management: pain level controlled Vital Signs Assessment: post-procedure vital signs reviewed and stable Respiratory status: spontaneous breathing, nonlabored ventilation and respiratory function stable Cardiovascular status: blood pressure returned to baseline and stable Postop Assessment: no signs of nausea or vomiting, patient able to bend at knees, spinal receding, no backache and no headache Anesthetic complications: no     Last Vitals:  Filed Vitals:   06/02/15 1045 06/02/15 1100  BP: 103/70 101/69  Pulse: 65 77  Temp:  36.4 C  Resp: 16 15    Last Pain:  Filed Vitals:   06/02/15 1105  PainSc: 5    Pain Goal:                 [name] A.

## 2015-06-02 NOTE — H&P (Signed)
Jenny Randolph is a 25 y.o. female presenting for repeat C/S. Maternal Medical History:  Reason for admission: Previous C/S x 2.  For repeat C/S.  Fetal activity: Perceived fetal activity is normal.   Last perceived fetal movement was within the past hour.    Prenatal complications: no prenatal complications Prenatal Complications - Diabetes: none.    OB History    Gravida Para Term Preterm AB TAB SAB Ectopic Multiple Living   4 2 2  0 1 0 1 0 0 2     Past Medical History  Diagnosis Date  . Complication of anesthesia     Hives, Rash, Itching  . Medical history non-contributory   . PONV (postoperative nausea and vomiting)    Past Surgical History  Procedure Laterality Date  . Cesarean section    . Dilation and curettage of uterus     Family History: family history includes Diabetes in her maternal grandfather, maternal grandmother, and mother; Hyperlipidemia in her maternal grandmother; Stroke in her father. Social History:  reports that she has never smoked. She has never used smokeless tobacco. She reports that she does not drink alcohol or use illicit drugs.   Prenatal Transfer Tool  Maternal Diabetes: No Genetic Screening: Normal Maternal Ultrasounds/Referrals: Normal Fetal Ultrasounds or other Referrals:  None Maternal Substance Abuse:  No Significant Maternal Medications:  None Significant Maternal Lab Results:  None Other Comments:  None  Review of Systems  All other systems reviewed and are negative.     Blood pressure 113/81, pulse 87, temperature 98 F (36.7 C), temperature source Oral, resp. rate 18, last menstrual period 09/29/2014, SpO2 99 %. Maternal Exam:  Abdomen: Patient reports no abdominal tenderness.   Physical Exam  Nursing note and vitals reviewed. Constitutional: She is oriented to person, place, and time. She appears well-developed and well-nourished.  HENT:  Head: Normocephalic and atraumatic.  Eyes: Conjunctivae are normal. Pupils  are equal, round, and reactive to light.  Neck: Normal range of motion. Neck supple.  Cardiovascular: Normal rate and regular rhythm.   Respiratory: Effort normal and breath sounds normal.  GI: Soft.  Musculoskeletal: Normal range of motion.  Neurological: She is alert and oriented to person, place, and time.  Skin: Skin is warm and dry.  Psychiatric: She has a normal mood and affect. Her behavior is normal. Judgment and thought content normal.    Prenatal labs: ABO, Rh: --/--/O POS, O POS (05/04 1215) Antibody: NEG (05/04 1215) Rubella: 3.40 (10/24 1147) RPR: Non Reactive (05/04 1215)  HBsAg: NEGATIVE (10/24 1147)  HIV: NONREACTIVE (02/28 1322)  GBS:     Assessment/Plan: 39 weeks.  Previous C/S x 2.  Desires repeat C/S.   Alsha Meland A 06/02/2015, 7:25 AM

## 2015-06-02 NOTE — Anesthesia Postprocedure Evaluation (Signed)
Anesthesia Post Note  Patient: Jenny Randolph  Procedure(s) Performed: Procedure(s) (LRB): REPEAT CESAREAN SECTION (N/A)  Patient location during evaluation: Mother Baby Anesthesia Type: Spinal Level of consciousness: awake, awake and alert, oriented and patient cooperative Pain management: pain level controlled Vital Signs Assessment: post-procedure vital signs reviewed and stable Respiratory status: spontaneous breathing, nonlabored ventilation and respiratory function stable Cardiovascular status: stable Postop Assessment: no headache, no backache, patient able to bend at knees and no signs of nausea or vomiting Anesthetic complications: no     Last Vitals:  Filed Vitals:   06/02/15 1325 06/02/15 1435  BP: 98/64 100/62  Pulse: 68 63  Temp: 36.6 C 36.6 C  Resp: 18 18    Last Pain:  Filed Vitals:   06/02/15 1449  PainSc: 0-No pain   Pain Goal: Patients Stated Pain Goal: 5 (06/02/15 1120)               Gwenette Wellons L

## 2015-06-03 LAB — CBC
HCT: 31.8 % — ABNORMAL LOW (ref 36.0–46.0)
Hemoglobin: 10.6 g/dL — ABNORMAL LOW (ref 12.0–15.0)
MCH: 29.4 pg (ref 26.0–34.0)
MCHC: 33.3 g/dL (ref 30.0–36.0)
MCV: 88.3 fL (ref 78.0–100.0)
PLATELETS: 189 10*3/uL (ref 150–400)
RBC: 3.6 MIL/uL — AB (ref 3.87–5.11)
RDW: 13.8 % (ref 11.5–15.5)
WBC: 10.2 10*3/uL (ref 4.0–10.5)

## 2015-06-03 LAB — CCBB MATERNAL DONOR DRAW

## 2015-06-03 NOTE — Lactation Note (Signed)
This note was copied from a baby's chart. Lactation Consultation Note  Baby latched on R side upon entering.  Sucks and swallows observed. Taught mother how to [place] baby.  Nipple has abrasion on tip.  Encouraged maintaining a deep latch. Mother states she has been unable to latch baby on L side. L nipple flatter than R. Provided mother with shells and suggest she wear shell on L side and R side if she desires for soreness and help evert nipples more. Had mother prepump on L side before attempting to latch. Attempted latching.  Baby mouthed nipple but did not sustain latch. Tried teacup hold and compression with no latch. Applied #20NS and baby easily latched.  Sucks and a few swallows observed. Encouraged mother to watch for colostrum in NS. Recommend she continue to try latching without NS after wearing shells before applying NS on L side. Suggest she post pump w/ manual pumpm for 5 min on L side if he continues to use NS to stimulate breast.    Patient Name: Jenny Randolph VWUJW'JToday's Date: 06/03/2015 Reason for consult: Follow-up assessment   Maternal Data    Feeding Feeding Type: Breast Fed Length of feed: 22 min  LATCH Score/Interventions Latch: Repeated attempts needed to sustain latch, nipple held in mouth throughout feeding, stimulation needed to elicit sucking reflex. Intervention(s): Skin to skin Intervention(s): Adjust position  Audible Swallowing: A few with stimulation  Type of Nipple: Everted at rest and after stimulation  Comfort (Breast/Nipple): Soft / non-tender     Hold (Positioning): Assistance needed to correctly position infant at breast and maintain latch.  LATCH Score: 7  Lactation Tools Discussed/Used Tools: Pump;Nipple Dorris CarnesShields;Shells Nipple shield size: 20 Shell Type: Inverted Breast pump type: Manual   Consult Status Consult Status: Follow-up Date: 06/04/15 Follow-up type: In-patient    Dahlia ByesBerkelhammer, Isaih Bulger [facility] 06/03/2015,  10:33 PM

## 2015-06-03 NOTE — Progress Notes (Signed)
Patient ID: Jenny Randolph, female   DOB: 01/02/1991, 25 y.o.   MRN: [id number] Postop day 1 Blood pressure 107/50 pulse 61 respiration 18 Abdomen soft Lochia moderate Legs negative doing well

## 2015-06-04 NOTE — Lactation Note (Addendum)
This note was copied from a baby's chart. Lactation Consultation Note  Patient Name: [name] Reason for consult: Follow-up assessment  Baby 51 hours old. Photographer in room. Mom reports that baby nursing well now. Mom states that baby just suddenly "figured it out." Enc mom to continue nursing with cues and to call for assistance with latching as needed. Maternal Data    Feeding Feeding Type: Breast Fed Length of feed: 30 min  LATCH Score/Interventions Latch: Repeated attempts needed to sustain latch, nipple held in mouth throughout feeding, stimulation needed to elicit sucking reflex. Intervention(s): Skin to skin;Teach feeding cues;Waking techniques Intervention(s): Adjust position;Assist with latch;Breast compression  Audible Swallowing: A few with stimulation Intervention(s): Skin to skin;Hand expression  Type of Nipple: Everted at rest and after stimulation  Comfort (Breast/Nipple): Soft / non-tender     Hold (Positioning): Assistance needed to correctly position infant at breast and maintain latch. Intervention(s): Breastfeeding basics reviewed;Support Pillows;Position options;Skin to skin  LATCH Score: 7  Lactation Tools Discussed/Used     Consult Status Consult Status: Follow-up Date: 06/05/15 Follow-up type: In-patient    Geralynn OchsWILLIARD, Shaira Sova Randolph, 12:30 PM

## 2015-06-04 NOTE — Progress Notes (Signed)
Patient ID: Jenny BuffaloJocelyn M Randolph, female   DOB: 04/19/1990, 25 y.o.   MRN: [id number] Postop day 2 Blood pressure 108/65 respiration 18 pulse 68 Fundus firm Dressing dry Legs negative doing well

## 2015-06-05 ENCOUNTER — Encounter: Payer: Self-pay | Admitting: Obstetrics

## 2015-06-05 MED ORDER — IBUPROFEN 600 MG PO TABS
600.0000 mg | ORAL_TABLET | Freq: Four times a day (QID) | ORAL | Status: DC | PRN
Start: 2015-06-05 — End: 2015-07-12

## 2015-06-05 MED ORDER — OXYCODONE-ACETAMINOPHEN 5-325 MG PO TABS: 1.0000 | ORAL_TABLET | ORAL | Status: DC | PRN

## 2015-06-05 NOTE — Lactation Note (Signed)
This note was copied from a baby's chart. Lactation Consultation Note  Patient Name: Boy Janne NapoleonJocelyn Schlotterbeck WUJWJ'XToday's Date: 06/05/2015 Reason for consult: Follow-up assessment;Infant weight loss Baby at 9.4% weight loss, now 72 hours old. BF 8 times in past 24 hours for 20-45 minutes, 3 voids/3 stools in past 24 hours. Weight loss in 24 hours was 3%. Mom reports baby is nursing well without the nipple shield. She has pumped and receiving colostrum. LC encouraged Mom to give colostrum back to baby after pumping, baby asleep at this visit. Encouraged to pump after feeding to have EBM to supplement till baby's weight loss stabilizes. Mom reports she should be getting her pump from insurance today or tomorrow. Advised to refer to Baby N Me booklet, page 25 for breast milk storage guidelines.  Engorgement care reviewed with Mom. LC left phone number for Mom to call with next feeding to assist with giving baby back the EBM via 5 fr feeding tube/syringe at breast and to observe feeding due to weight loss. Discussed OP f/u, Mom reports she would call if desired.  Maternal Data    Feeding Feeding Type: Breast Fed  LATCH Score/Interventions Latch: Repeated attempts needed to sustain latch, nipple held in mouth throughout feeding, stimulation needed to elicit sucking reflex. Intervention(s): Assist with latch;Adjust position  Audible Swallowing: Spontaneous and intermittent  Type of Nipple: Flat  Comfort (Breast/Nipple): Soft / non-tender     Hold (Positioning): Assistance needed to correctly position infant at breast and maintain latch.  LATCH Score: 7  Lactation Tools Discussed/Used Tools: Nipple Dorris CarnesShields;Pump Nipple shield size: 20 Breast pump type: Manual Pump Review: Setup, frequency, and cleaning;Milk Storage Initiated by:: Jeri LagerK. Shemo, RN  Date initiated:: 06/05/15   Consult Status Consult Status: Follow-up Date: 06/05/15 Follow-up type: In-patient    Alfred LevinsGranger, Candise Crabtree Ann 06/05/2015, 9:09  AM

## 2015-06-05 NOTE — Progress Notes (Signed)
Subjective:    Jenny Randolph is a 25 y.o. female being seen today for her obstetrical visit. She is at 646w4d gestation. Patient reports no complaints. Fetal movement: normal.  Problem List Items Addressed This Visit    None    Visit Diagnoses    Prenatal care, third trimester    -  Primary    Relevant Orders    POCT urinalysis dipstick (Completed)      Patient Active Problem List   Diagnosis Date Noted  . Cesarean section wound complication 06/02/2015  . Masses of both breasts 06/21/2014    Objective:    BP 119/74 mmHg  Pulse 97  Temp(Src) 98.6 F (37 C)  Wt 185 lb (83.915 kg)  LMP 09/29/2014 (Exact Date) FHT: 150 BPM  Uterine Size: size equals dates  Presentations: cephalic  Pelvic Exam:              Dilation: 1cm       Effacement: 50%             Station:  -3    Consistency: medium            Position: posterior     Assessment:    Pregnancy @ 556w4d weeks   Plan:   Plans for delivery: C/Section scheduled; labs reviewed; problem list updated Counseling: Consent signed. Infant feeding: plans to breastfeed. Cigarette smoking: never smoked. L&D discussion: symptoms of labor, discussed when to call, discussed what number to call, anesthetic/analgesic options reviewed and delivering clinician:  plans Physician. Postpartum supports and preparation: circumcision discussed and contraception plans discussed.  Follow up in post op.

## 2015-06-05 NOTE — Discharge Summary (Signed)
Obstetric Discharge Summary Reason for Admission: cesarean section Prenatal Procedures: ultrasound Intrapartum Procedures: cesarean: low cervical, transverse Postpartum Procedures: none Complications-Operative and Postpartum: none HEMOGLOBIN  Date Value Ref Range Status  06/03/2015 10.6* 12.0 - 15.0 g/dL Final   HGB  Date Value Ref Range Status  02/05/2011 12.2 12.0-16.0 g/dL Final   HCT  Date Value Ref Range Status  06/03/2015 31.8* 36.0 - 46.0 % Final  02/07/2011 33.8* 35.0-47.0 % Final    Physical Exam:  General: alert and no distress Lochia: appropriate Uterine Fundus: firm Incision: healing well DVT Evaluation: No evidence of DVT seen on physical exam.  Discharge Diagnoses: Term Pregnancy-delivered  Discharge Information: Date: 06/05/2015 Activity: pelvic rest Diet: routine Medications: PNV, Ibuprofen, Colace and Percocet Condition: stable Instructions: refer to practice specific booklet Discharge to: home Follow-up Information    Follow up with Curtis Cain A, MD In 2 weeks.   Specialty:  Obstetrics and Gynecology   Contact information:   [address] [place] [address] [phone]       Newborn Data: Live born female  Birth Weight: 7 lb 5.8 oz (3340 g) APGAR: 9, 9  Home with mother.  Starla Deller A 06/05/2015, 12:32 PM

## 2015-06-05 NOTE — Lactation Note (Signed)
This note was copied from a baby's chart. Lactation Consultation Note  Patient Name: Jenny Randolph ACZYS'AToday's Date: 06/05/2015 Reason for consult: Follow-up assessment;Infant weight loss Assisted Mom with positioning and obtaining good depth with latch. Mom's nipples are flat but compressible and baby was able to obtain good depth using breast compression.  Demonstrated how to use 5 fr feeding tube/syringe at breast to supplement. After BF on left breast, demonstrated how to finger feed remaining EBM using 5 fr feeding tube/syringe. Baby then wanting to go back to breast. Feeding plan discussed with Mom: BF with each feeding 8-12 times or more in 24 hours. On 1st breast do not give supplement, keep baby nursing for 15-20 minutes, give supplement at breast on 2nd breast. Alternate this pattern each feeding. Mom to post pump for 15 minutes after feedings except at night to have EBM to supplement and protect milk supply. If using 5 fr feeding tube overwhelming at breast then try the finger feeding or use Dr. Manson PasseyBrown #1 bottle which Mom has at home. Mom getting DEBP from insurance will use manual pump till then.  Also advised Mom to pre-pump for few minutes to help with latch especially once her milk comes in due to short, flat nipples. Encouraged OP f/u, Mom will call. Advised of support group.   Maternal Data    Feeding Feeding Type: Breast Fed Length of feed: 15 min  LATCH Score/Interventions Latch: Repeated attempts needed to sustain latch, nipple held in mouth throughout feeding, stimulation needed to elicit sucking reflex. Intervention(s): Adjust position;Assist with latch;Breast massage;Breast compression  Audible Swallowing: Spontaneous and intermittent  Type of Nipple: Flat  Comfort (Breast/Nipple): Filling, red/small blisters or bruises, mild/mod discomfort  Problem noted: Filling;Mild/Moderate discomfort Interventions (Mild/moderate discomfort): Pre-pump if needed;Hand massage;Hand  expression;Post-pump  Hold (Positioning): Assistance needed to correctly position infant at breast and maintain latch. Intervention(s): Breastfeeding basics reviewed;Support Pillows;Position options;Skin to skin  LATCH Score: 6  Lactation Tools Discussed/Used Tools: Pump;Nipple Shields;66F feeding tube / Syringe Nipple shield size: 20 Breast pump type: Double-Electric Breast Pump   Consult Status Consult Status: Follow-up Date: 06/05/15 Follow-up type: In-patient    Alfred LevinsGranger, Kelvis Berger Ann 06/05/2015, 12:04 PM

## 2015-06-05 NOTE — Progress Notes (Signed)
Subjective: Postpartum Day 3: Cesarean Delivery Patient reports tolerating PO, + flatus, + BM and no problems voiding.    Objective: Vital signs in last 24 hours: Temp:  [98.3 F (36.8 C)-98.4 F (36.9 C)] 98.3 F (36.8 C) (05/08 0500) Pulse Rate:  [79-82] 79 (05/08 0500) Resp:  [18-20] 20 (05/08 0500) BP: (107-108)/(59-60) 108/60 mmHg (05/08 0500)  Physical Exam:  General: alert and no distress Lochia: appropriate Uterine Fundus: firm Incision: healing well DVT Evaluation: No evidence of DVT seen on physical exam.   Recent Labs  06/03/15 0516  HGB 10.6*  HCT 31.8*    Assessment/Plan: Status post Cesarean section. Doing well postoperatively.  Discharge home with standard precautions and return to clinic in 2 weeks.  HARPER,CHARLES A 06/05/2015, 12:24 PM

## 2015-06-07 ENCOUNTER — Ambulatory Visit: Payer: BLUE CROSS/BLUE SHIELD | Admitting: Obstetrics

## 2015-06-08 ENCOUNTER — Other Ambulatory Visit: Payer: Self-pay | Admitting: Certified Nurse Midwife

## 2015-06-14 ENCOUNTER — Telehealth (HOSPITAL_COMMUNITY): Payer: Self-pay | Admitting: Lactation Services

## 2015-06-14 NOTE — Telephone Encounter (Signed)
Noticed today that she is not able to pump as much as much as in days past. She is using DEBP every 2-3 hr around the clock. She could pump 3-5 oz from each side every time and now she is getting 1-1.5 oz each side. She is massaging while pumping.  Warm moist heat before pumping, 3 days of power pumping, do manual expression after each pumping, and sts as much as possible. Keep trying to latch baby. If supply does not improve in 2-3 days call back or make an OP apt.

## 2015-06-15 ENCOUNTER — Ambulatory Visit (INDEPENDENT_AMBULATORY_CARE_PROVIDER_SITE_OTHER): Payer: BLUE CROSS/BLUE SHIELD | Admitting: Obstetrics

## 2015-06-15 VITALS — BP 101/69 | HR 81 | Wt 167.0 lb

## 2015-06-15 DIAGNOSIS — R309 Painful micturition, unspecified: Secondary | ICD-10-CM

## 2015-06-15 DIAGNOSIS — Z331 Pregnant state, incidental: Secondary | ICD-10-CM

## 2015-06-15 LAB — POCT URINALYSIS DIPSTICK
Bilirubin, UA: NEGATIVE
GLUCOSE UA: NEGATIVE
KETONES UA: NEGATIVE
Nitrite, UA: NEGATIVE
PROTEIN UA: NEGATIVE
RBC UA: NEGATIVE
SPEC GRAV UA: 1.015
Urobilinogen, UA: NEGATIVE
pH, UA: 5

## 2015-06-16 ENCOUNTER — Encounter: Payer: Self-pay | Admitting: Obstetrics

## 2015-06-16 NOTE — Progress Notes (Signed)
Subjective:     Jenny BuffaloJocelyn M Randolph is a 25 y.o. female who presents for a postpartum visit. She is 2 weeks postpartum following a low cervical transverse Cesarean section. I have fully reviewed the prenatal and intrapartum course. The delivery was at 39 gestational weeks. Outcome: repeat cesarean section, low transverse incision. Anesthesia: spinal. Postpartum course has been normal. Baby's course has been normal. Baby is feeding by breast. Bleeding thin lochia. Bowel function is normal. Bladder function is normal. Patient is not sexually active. Contraception method is abstinence. Postpartum depression screening: negative.  Tobacco, alcohol and substance abuse history reviewed.  Adult immunizations reviewed including TDAP, rubella and varicella.  The following portions of the patient's history were reviewed and updated as appropriate: allergies, current medications, past family history, past medical history, past social history, past surgical history and problem list.  Review of Systems A comprehensive review of systems was negative.   Objective:    BP 101/69 mmHg  Pulse 81  Wt 167 lb (75.751 kg)  LMP 09/29/2014 (Exact Date)   PE:      General:  Alert and no distress      Breasts:  Soft, non tender      Abdomen:  Soft, non tender.  Incision C, D, I.      Extremities:  No C, C, E.   Assessment:    2 weeks postpartum.  Doing well.  Contraceptive counseling and advice  Plan:    1. Contraception: Considering options 2. Continue PNV's 3. Follow up in: 4 weeks or as needed.   Healthy lifestyle practices reviewed

## 2015-06-17 LAB — URINE CULTURE

## 2015-07-12 ENCOUNTER — Encounter: Payer: Self-pay | Admitting: *Deleted

## 2015-07-12 ENCOUNTER — Ambulatory Visit (INDEPENDENT_AMBULATORY_CARE_PROVIDER_SITE_OTHER): Payer: BLUE CROSS/BLUE SHIELD | Admitting: Obstetrics

## 2015-07-12 DIAGNOSIS — Z30014 Encounter for initial prescription of intrauterine contraceptive device: Secondary | ICD-10-CM

## 2015-07-13 ENCOUNTER — Encounter: Payer: Self-pay | Admitting: Obstetrics

## 2015-07-13 NOTE — Progress Notes (Signed)
Subjective:     Jenny Randolph is a 25 y.o. female who presents for a postpartum visit. She is several weeks postpartum following a low cervical transverse Cesarean section. I have fully reviewed the prenatal and intrapartum course. The delivery was at 39 gestational weeks. Outcome: repeat cesarean section, low transverse incision. Anesthesia: spinal. Postpartum course has been normal. Baby's course has been normal. Baby is feeding by breast. Bleeding no bleeding. Bowel function is normal. Bladder function is normal. Patient is not sexually active. Contraception method is abstinence. Postpartum depression screening: negative.  Tobacco, alcohol and substance abuse history reviewed.  Adult immunizations reviewed including TDAP, rubella and varicella.  The following portions of the patient's history were reviewed and updated as appropriate: allergies, current medications, past family history, past medical history, past social history, past surgical history and problem list.  Review of Systems A comprehensive review of systems was negative.   Objective:    There were no vitals taken for this visit.  General:  alert and no distress   Breasts:  inspection negative, no nipple discharge or bleeding, no masses or nodularity palpable  Lungs: clear to auscultation bilaterally  Heart:  regular rate and rhythm, S1, S2 normal, no murmur, click, rub or gallop  Abdomen: normal findings: soft, non-tender   Vulva:  normal  Vagina: normal vagina  Cervix:  no cervical motion tenderness  Corpus: normal size, contour, position, consistency, mobility, non-tender  Adnexa:  no mass, fullness, tenderness  Rectal Exam: Not performed.           Assessment:     Normal postpartum exam. Pap smear not done at today's visit.    Contraceptive counseling and advice  Plan:    1. Contraception: Wants IUD ( ParaGuard ) 2. ParaGuard IUD Rx 3. Follow up in: 6 weeks or as needed.   Healthy lifestyle practices  reviewed

## 2015-07-14 ENCOUNTER — Telehealth: Payer: Self-pay

## 2015-07-14 NOTE — Telephone Encounter (Signed)
Patient's FMLA paperwork ready for pick-up 15.00 charge, left vm 07/15/15

## 2015-07-26 ENCOUNTER — Ambulatory Visit: Payer: BLUE CROSS/BLUE SHIELD | Admitting: Obstetrics

## 2015-07-31 ENCOUNTER — Ambulatory Visit: Payer: BLUE CROSS/BLUE SHIELD | Admitting: Obstetrics

## 2015-07-31 ENCOUNTER — Other Ambulatory Visit: Payer: Self-pay | Admitting: Certified Nurse Midwife

## 2015-08-04 ENCOUNTER — Ambulatory Visit: Payer: BLUE CROSS/BLUE SHIELD | Admitting: Obstetrics

## 2015-08-14 ENCOUNTER — Ambulatory Visit (INDEPENDENT_AMBULATORY_CARE_PROVIDER_SITE_OTHER): Payer: BLUE CROSS/BLUE SHIELD | Admitting: Obstetrics

## 2015-08-14 ENCOUNTER — Encounter: Payer: Self-pay | Admitting: Obstetrics

## 2015-08-14 VITALS — BP 105/70 | HR 60 | Temp 98.1°F | Wt 162.0 lb

## 2015-08-14 DIAGNOSIS — Z30430 Encounter for insertion of intrauterine contraceptive device: Secondary | ICD-10-CM | POA: Diagnosis not present

## 2015-08-14 DIAGNOSIS — Z30014 Encounter for initial prescription of intrauterine contraceptive device: Secondary | ICD-10-CM | POA: Diagnosis not present

## 2015-08-14 DIAGNOSIS — Z113 Encounter for screening for infections with a predominantly sexual mode of transmission: Secondary | ICD-10-CM

## 2015-08-14 DIAGNOSIS — Z3202 Encounter for pregnancy test, result negative: Secondary | ICD-10-CM | POA: Diagnosis not present

## 2015-08-14 DIAGNOSIS — Z3049 Encounter for surveillance of other contraceptives: Secondary | ICD-10-CM | POA: Diagnosis not present

## 2015-08-14 LAB — POCT URINE PREGNANCY: Preg Test, Ur: NEGATIVE

## 2015-08-14 NOTE — Progress Notes (Signed)
IUD Insertion Procedure Note  Pre-operative Diagnosis: Desires LARC  Post-operative Diagnosis: same  Indications: contraception  Procedure Details  Urine pregnancy test was done in office and result was negative.  The risks (including infection, bleeding, pain, and uterine perforation) and benefits of the procedure were explained to the patient and Written informed consent was obtained.    Cervix cleansed with Betadine. Uterus sounded to 8 cm. IUD inserted without difficulty. String visible and trimmed. Patient tolerated procedure well.  IUD Information: Mirena, Lot # [id number], Expiration date 2702 / 20.  Condition: Stable  Complications: None  Plan:  The patient was advised to call for any fever or for prolonged or severe pain or bleeding. She was advised to use NSAID as needed for mild to moderate pain.   Attending Physician Documentation: I was present for or participated in the entire procedure, including opening and closing.

## 2015-08-22 LAB — NUSWAB VG+, CANDIDA 6SP
CANDIDA ALBICANS, NAA: NEGATIVE
CANDIDA GLABRATA, NAA: NEGATIVE
CANDIDA KRUSEI, NAA: NEGATIVE
CANDIDA LUSITANIAE, NAA: NEGATIVE
Candida parapsilosis, NAA: NEGATIVE
Candida tropicalis, NAA: NEGATIVE
Chlamydia trachomatis, NAA: NEGATIVE
Neisseria gonorrhoeae, NAA: NEGATIVE
TRICH VAG BY NAA: NEGATIVE

## 2015-09-25 ENCOUNTER — Ambulatory Visit: Payer: BLUE CROSS/BLUE SHIELD | Admitting: Obstetrics

## 2015-10-05 ENCOUNTER — Encounter: Payer: Self-pay | Admitting: Obstetrics

## 2015-10-05 ENCOUNTER — Ambulatory Visit (INDEPENDENT_AMBULATORY_CARE_PROVIDER_SITE_OTHER): Payer: BLUE CROSS/BLUE SHIELD | Admitting: Obstetrics

## 2015-10-05 VITALS — BP 117/77 | HR 62 | Temp 98.7°F | Wt 161.0 lb

## 2015-10-05 DIAGNOSIS — R102 Pelvic and perineal pain: Secondary | ICD-10-CM

## 2015-10-05 DIAGNOSIS — Z30431 Encounter for routine checking of intrauterine contraceptive device: Secondary | ICD-10-CM

## 2015-10-05 DIAGNOSIS — T8332XA Displacement of intrauterine contraceptive device, initial encounter: Secondary | ICD-10-CM

## 2015-10-05 NOTE — Addendum Note (Signed)
Addended by: Marya LandryFOSTER, Oziah Vitanza D on: 10/05/2015 04:25 PM   Modules accepted: Orders

## 2015-10-05 NOTE — Progress Notes (Signed)
Subjective:    Jenny Randolph is a 25 y.o. female who presents for contraception counseling. The patient has no complaints today. The patient is sexually active. Pertinent past medical history: none.  Has had LLQ abdominal pain after eating since IUD placed.  Says that the same thing happened with previous IUD and it had to be removed.  The information documented in the HPI was reviewed and verified.  Menstrual History: OB History    Gravida Para Term Preterm AB Living   4 3 3  0 1 3   SAB TAB Ectopic Multiple Live Births   1 0 0 0 3       Patient's last menstrual period was 09/14/2015.   Patient Active Problem List   Diagnosis Date Noted  . Cesarean section wound complication 06/02/2015  . Masses of both breasts 06/21/2014   Past Medical History:  Diagnosis Date  . Complication of anesthesia    Hives, Rash, Itching  . Medical history non-contributory   . PONV (postoperative nausea and vomiting)     Past Surgical History:  Procedure Laterality Date  . CESAREAN SECTION    . CESAREAN SECTION N/A 06/02/2015   Procedure: REPEAT CESAREAN SECTION;  Surgeon: Brock Bad, MD;  Location: [facility] BIRTHING SU[facility];  Service: Obstetrics;  Laterality: N/A;  . DILATION AND CURETTAGE OF UTERUS       Current Outpatient Prescriptions:  .  levonorgestrel (MIRENA) 20 MCG/24HR IUD, 1 each by Intrauterine route once., Disp: , Rfl:  Allergies  Allergen Reactions  . Penicillins Hives    The whole -cillin family.  Has patient had a PCN reaction causing immediate rash, facial/tongue/throat swelling, SOB or lightheadedness with hypotension: yes Has patient had a PCN reaction causing severe rash involving mucus membranes or skin necrosis: unknown Has patient had a PCN reaction that required hospitalization pt was in hospital when reaction occured Has patient had a PCN reaction occurring within the last 10 years: No If all of the above answers are "NO", then may proceed with Cephalospo    Social  History  Substance Use Topics  . Smoking status: Never Smoker  . Smokeless tobacco: Never Used  . Alcohol use No    Family History  Problem Relation Age of Onset  . Diabetes Mother   . Stroke Father   . Diabetes Maternal Grandmother   . Hyperlipidemia Maternal Grandmother   . Diabetes Maternal Grandfather        Review of Systems Constitutional: negative for weight loss Genitourinary:negative for abnormal menstrual periods and vaginal discharge   Objective:   BP 117/77   Pulse 62   Temp 98.7 F (37.1 C)   Wt 161 lb (73 kg)   LMP 09/14/2015   BMI 30.42 kg/m    General:   alert  Skin:   no rash or abnormalities  Lungs:   clear to auscultation bilaterally  Heart:   regular rate and rhythm, S1, S2 normal, no murmur, click, rub or gallop  Breasts:   normal without suspicious masses, skin or nipple changes or axillary nodes  Abdomen:  normal findings: no organomegaly, soft, non-tender and no hernia  Pelvis:  External genitalia: normal general appearance Urinary system: urethral meatus normal and bladder without fullness, nontender Vaginal: normal without tenderness, induration or masses Cervix: normal appearance.  IUD string visible and normal length. Adnexa: normal bimanual exam Uterus: anteverted and non-tender, normal size   Lab Review Urine pregnancy test Labs reviewed yes Radiologic studies reviewed no  50% of 15  min visit spent on counseling and coordination of care.   Assessment:    25 y.o., continuing IUD, no contraindications.   Wants to keep IUD if not malpositioned  Plan:    All questions answered. Chlamydia specimen. Diagnosis explained in detail, including differential. Follow up in 2 weeks. GC specimen. Pelvic ultrasound. Wet prep.    Meds ordered this encounter  Medications  . levonorgestrel (MIRENA) 20 MCG/24HR IUD    Sig: 1 each by Intrauterine route once.   Orders Placed This Encounter  Procedures  . US Transvaginal Non-OB     Standing Status:   Future    Standing Expiration Date:   12/04/2016    Order Specific Question:   Reason for Exam (SYMPTOM  OR DIAGNOSIS REQUIRED)    Answer:   iud placement    Order Specific Question:   Preferred imaging location?    Answer:   Internal  . US Pelvis Complete    Standing Status:   Future    Standing Expiration Date:   12/04/2016    Order Specific Question:   Reason for Exam (SYMPTOM  OR DIAGNOSIS REQUIRED)    Answer:   iud placement    Order Specific Question:   Preferred imaging location?    Answer:   Internal

## 2015-10-11 ENCOUNTER — Other Ambulatory Visit: Payer: Self-pay | Admitting: Obstetrics

## 2015-10-11 ENCOUNTER — Telehealth: Payer: Self-pay | Admitting: *Deleted

## 2015-10-11 DIAGNOSIS — B9689 Other specified bacterial agents as the cause of diseases classified elsewhere: Secondary | ICD-10-CM

## 2015-10-11 DIAGNOSIS — N760 Acute vaginitis: Principal | ICD-10-CM

## 2015-10-11 LAB — NUSWAB VG+, CANDIDA 6SP
CANDIDA GLABRATA, NAA: NEGATIVE
CANDIDA PARAPSILOSIS, NAA: NEGATIVE
CANDIDA TROPICALIS, NAA: NEGATIVE
CHLAMYDIA TRACHOMATIS, NAA: NEGATIVE
Candida albicans, NAA: NEGATIVE
Candida krusei, NAA: NEGATIVE
Candida lusitaniae, NAA: NEGATIVE
MEGASPHAERA 1: HIGH {score} — AB
Neisseria gonorrhoeae, NAA: NEGATIVE
Trich vag by NAA: NEGATIVE

## 2015-10-11 MED ORDER — METRONIDAZOLE 500 MG PO TABS: 500.0000 mg | ORAL_TABLET | Freq: Two times a day (BID) | ORAL | 2 refills | Status: DC

## 2015-10-11 NOTE — Telephone Encounter (Signed)
Patient made aware of lab result and medication at her pharmacy.Cautioned not to drink alcohol with this medication it can make her vomit.

## 2015-10-11 NOTE — Telephone Encounter (Signed)
Phone message left to call for lab results. 

## 2015-10-17 ENCOUNTER — Ambulatory Visit (HOSPITAL_COMMUNITY)
Admission: RE | Admit: 2015-10-17 | Discharge: 2015-10-17 | Disposition: A | Discharge status: Home / Self Care | Payer: BLUE CROSS/BLUE SHIELD | Source: Ambulatory Visit | Attending: Obstetrics | Admitting: Obstetrics

## 2015-10-17 DIAGNOSIS — T8332XA Displacement of intrauterine contraceptive device, initial encounter: Secondary | ICD-10-CM

## 2015-10-17 DIAGNOSIS — Z975 Presence of (intrauterine) contraceptive device: Secondary | ICD-10-CM | POA: Insufficient documentation

## 2015-10-17 DIAGNOSIS — R1032 Left lower quadrant pain: Secondary | ICD-10-CM | POA: Insufficient documentation

## 2015-10-26 ENCOUNTER — Encounter: Payer: Self-pay | Admitting: Obstetrics

## 2015-10-26 ENCOUNTER — Ambulatory Visit (INDEPENDENT_AMBULATORY_CARE_PROVIDER_SITE_OTHER): Payer: BLUE CROSS/BLUE SHIELD | Admitting: Obstetrics

## 2015-10-26 ENCOUNTER — Other Ambulatory Visit: Payer: BLUE CROSS/BLUE SHIELD

## 2015-10-26 VITALS — BP 105/62 | HR 62 | Temp 98.7°F | Wt 158.5 lb

## 2015-10-26 DIAGNOSIS — Z30431 Encounter for routine checking of intrauterine contraceptive device: Secondary | ICD-10-CM | POA: Diagnosis not present

## 2015-10-26 DIAGNOSIS — R102 Pelvic and perineal pain: Secondary | ICD-10-CM | POA: Diagnosis not present

## 2015-10-26 NOTE — Progress Notes (Signed)
Subjective:    Jenny Randolph is a 25 y.o. female who presents for contraception counseling.  Initially presented with pelvic pain.  Ultrasound ordered for IUD position.  Presents today for ultrasound results.  Pelvic pain has resolved. The patient has no complaints today. The patient is sexually active. Pertinent past medical history: none.  The information documented in the HPI was reviewed and verified.  Menstrual History: OB History    Gravida Para Term Preterm AB Living   4 3 3  0 1 3   SAB TAB Ectopic Multiple Live Births   1 0 0 0 3     Patient's last menstrual period was 09/14/2015.   Patient Active Problem List   Diagnosis Date Noted  . Cesarean section wound complication 06/02/2015  . Masses of both breasts 06/21/2014   Past Medical History:  Diagnosis Date  . Complication of anesthesia    Hives, Rash, Itching  . Medical history non-contributory   . PONV (postoperative nausea and vomiting)     Past Surgical History:  Procedure Laterality Date  . CESAREAN SECTION    . CESAREAN SECTION N/A 06/02/2015   Procedure: REPEAT CESAREAN SECTION;  Surgeon: Brock Badharles A Milica Gully, MD;  [facility];  Service: Obstetrics;  Laterality: N/A;  . DILATION AND CURETTAGE OF UTERUS       Current Outpatient Prescriptions:  .  levonorgestrel (MIRENA) 20 MCG/24HR IUD, 1 each by Intrauterine route once., Disp: , Rfl:  Allergies  Allergen Reactions  . Penicillins Hives    The whole -cillin family.  Has patient had a PCN reaction causing immediate rash, facial/tongue/throat swelling, SOB or lightheadedness with hypotension: yes Has patient had a PCN reaction causing severe rash involving mucus membranes or skin necrosis: unknown Has patient had a PCN reaction that required hospitalization pt was in hospital when reaction occured Has patient had a PCN reaction occurring within the last 10 years: No If all of the above answers are "NO", then may proceed with Cephalospo     Social History  Substance Use Topics  . Smoking status: Never Smoker  . Smokeless tobacco: Never Used  . Alcohol use No    Family History  Problem Relation Age of Onset  . Diabetes Mother   . Stroke Father   . Diabetes Maternal Grandmother   . Hyperlipidemia Maternal Grandmother   . Diabetes Maternal Grandfather        Review of Systems Constitutional: negative for weight loss Genitourinary:negative for abnormal menstrual periods and vaginal discharge   Objective:   BP 105/62   Pulse 62   Temp 98.7 F (37.1 C)   Wt 158 lb 8 oz (71.9 kg)   LMP 09/14/2015 Comment: IUD  Breastfeeding? No   BMI 29.95 kg/m         PE:  Deferred  Lab Review Urine pregnancy test Labs reviewed yes Radiologic studies reviewed yes  >50% of 10 min visit spent on counseling and coordination of care.   Assessment:    25 y.o., continuing IUD, no contraindications.   Plan:    All questions answered. Follow up in 6 weeks.    No orders of the defined types were placed in this encounter.  No orders of the defined types were placed in this encounter.

## 2015-12-07 ENCOUNTER — Ambulatory Visit: Payer: Self-pay | Admitting: Obstetrics

## 2015-12-18 ENCOUNTER — Encounter: Payer: Self-pay | Admitting: Obstetrics and Gynecology

## 2015-12-18 ENCOUNTER — Ambulatory Visit (INDEPENDENT_AMBULATORY_CARE_PROVIDER_SITE_OTHER): Payer: BLUE CROSS/BLUE SHIELD | Admitting: Obstetrics and Gynecology

## 2015-12-18 VITALS — BP 105/72 | HR 64 | Temp 98.8°F | Ht 61.0 in | Wt 152.0 lb

## 2015-12-18 DIAGNOSIS — Z01419 Encounter for gynecological examination (general) (routine) without abnormal findings: Secondary | ICD-10-CM | POA: Diagnosis not present

## 2015-12-18 DIAGNOSIS — Z113 Encounter for screening for infections with a predominantly sexual mode of transmission: Secondary | ICD-10-CM | POA: Diagnosis not present

## 2015-12-18 DIAGNOSIS — Z1151 Encounter for screening for human papillomavirus (HPV): Secondary | ICD-10-CM

## 2015-12-18 DIAGNOSIS — Z124 Encounter for screening for malignant neoplasm of cervix: Secondary | ICD-10-CM | POA: Diagnosis not present

## 2015-12-18 NOTE — Progress Notes (Signed)
Subjective:     Jenny Randolph is a 25 y.o. female [phone] [id number] with BMI 28.7 who is here for a comprehensive physical exam. The patient reports no problems. She is sexually active using IUD for contraception. She denies any pelvic pain or abnormal discharge. She reports light spotting around the time of her period for a few days.  Past Medical History:  Diagnosis Date  . Complication of anesthesia    Hives, Rash, Itching  . Medical history non-contributory   . PONV (postoperative nausea and vomiting)    Past Surgical History:  Procedure Laterality Date  . CESAREAN SECTION    . CESAREAN SECTION N/A 06/02/2015   Procedure: REPEAT CESAREAN SECTION;  Surgeon: Brock Badharles A Harper, MD;  [facility];  Service: Obstetrics;  Laterality: N/A;  . DILATION AND CURETTAGE OF UTERUS     Family History  Problem Relation Age of Onset  . Diabetes Mother   . Stroke Father   . Diabetes Maternal Grandmother   . Hyperlipidemia Maternal Grandmother   . Diabetes Maternal Grandfather     Social History   Social History  . Marital status: Single    Spouse name: N/A  . Number of children: N/A  . Years of education: N/A   Occupational History  . Not on file.   Social History Main Topics  . Smoking status: Never Smoker  . Smokeless tobacco: Never Used  . Alcohol use No  . Drug use: No  . Sexual activity: Yes    Partners: Male    Birth control/ protection: IUD   Other Topics Concern  . Not on file   Social History Narrative  . No narrative on file   Health Maintenance  Topic Date Due  . INFLUENZA VACCINE  08/29/2015  . PAP SMEAR  06/20/2017  . TETANUS/TDAP  06/02/2025  . HIV Screening  Completed       Review of Systems Pertinent items are noted in HPI.   Objective:  Blood pressure 105/72, pulse 64, temperature 98.8 F (37.1 C), temperature source Oral, height 5\' 1"  (1.549 m), weight 152 lb (68.9 kg), not currently breastfeeding.     GENERAL: Well-developed,  well-nourished female in no acute distress.  HEENT: Normocephalic, atraumatic. Sclerae anicteric.  NECK: Supple. Normal thyroid.  LUNGS: Clear to auscultation bilaterally.  HEART: Regular rate and rhythm. BREASTS: Symmetric in size. No palpable masses or lymphadenopathy, skin changes, or nipple drainage. ABDOMEN: Soft, nontender, nondistended. No organomegaly. PELVIC: Normal external female genitalia. Vagina is pink and rugated.  Normal discharge. Normal appearing cervix. Uterus is normal in size. No adnexal mass or tenderness. EXTREMITIES: No cyanosis, clubbing, or edema, 2+ distal pulses.    Assessment:    Healthy female exam.      Plan:    pap smear collected Patient desires full STD testing Fasting labs obtained Patient will be contacted with any abnormal results RTC in a year or prn See After Visit Summary for Counseling Recommendations

## 2015-12-18 NOTE — Progress Notes (Signed)
Patient is in the office for annual exam. 

## 2015-12-19 LAB — COMPREHENSIVE METABOLIC PANEL
A/G RATIO: 2.2 (ref 1.2–2.2)
ALBUMIN: 4.8 g/dL (ref 3.5–5.5)
ALT: 16 IU/L (ref 0–32)
AST: 20 IU/L (ref 0–40)
Alkaline Phosphatase: 64 IU/L (ref 39–117)
BILIRUBIN TOTAL: 1.2 mg/dL (ref 0.0–1.2)
BUN / CREAT RATIO: 14 (ref 9–23)
BUN: 11 mg/dL (ref 6–20)
CHLORIDE: 101 mmol/L (ref 96–106)
CO2: 23 mmol/L (ref 18–29)
Calcium: 9.4 mg/dL (ref 8.7–10.2)
Creatinine, Ser: 0.76 mg/dL (ref 0.57–1.00)
GFR calc non Af Amer: 109 mL/min/{1.73_m2} (ref >59)
GFR, EST AFRICAN AMERICAN: 126 mL/min/{1.73_m2} (ref >59)
Globulin, Total: 2.2 g/dL (ref 1.5–4.5)
Glucose: 83 mg/dL (ref 65–99)
POTASSIUM: 4.6 mmol/L (ref 3.5–5.2)
Sodium: 142 mmol/L (ref 134–144)
TOTAL PROTEIN: 7 g/dL (ref 6.0–8.5)

## 2015-12-19 LAB — LIPID PANEL
CHOL/HDL RATIO: 2.3 ratio (ref 0.0–4.4)
Cholesterol, Total: 150 mg/dL (ref 100–199)
HDL: 64 mg/dL (ref >39)
LDL Calculated: 77 mg/dL (ref 0–99)
Triglycerides: 45 mg/dL (ref 0–149)
VLDL CHOLESTEROL CAL: 9 mg/dL (ref 5–40)

## 2015-12-19 LAB — TSH: TSH: 1.42 u[IU]/mL (ref 0.450–4.500)

## 2015-12-19 LAB — HIV ANTIBODY (ROUTINE TESTING W REFLEX): HIV Screen 4th Generation wRfx: NONREACTIVE

## 2015-12-19 LAB — CBC
HEMOGLOBIN: 13.4 g/dL (ref 11.1–15.9)
Hematocrit: 40.9 % (ref 34.0–46.6)
MCH: 28.5 pg (ref 26.6–33.0)
MCHC: 32.8 g/dL (ref 31.5–35.7)
MCV: 87 fL (ref 79–97)
Platelets: 234 10*3/uL (ref 150–379)
RBC: 4.71 x10E6/uL (ref 3.77–5.28)
RDW: 14 % (ref 12.3–15.4)
WBC: 4.6 10*3/uL (ref 3.4–10.8)

## 2015-12-19 LAB — RPR: RPR: NONREACTIVE

## 2015-12-19 LAB — HEMOGLOBIN A1C
Est. average glucose Bld gHb Est-mCnc: 111 mg/dL
HEMOGLOBIN A1C: 5.5 % (ref 4.8–5.6)

## 2015-12-19 LAB — HEPATITIS B SURFACE ANTIGEN: Hepatitis B Surface Ag: NEGATIVE

## 2015-12-19 LAB — HEPATITIS C ANTIBODY: Hep C Virus Ab: <0.1 s/co ratio (ref 0.0–0.9)

## 2015-12-20 LAB — CYTOLOGY - PAP
Chlamydia: NEGATIVE
Diagnosis: NEGATIVE
Neisseria Gonorrhea: NEGATIVE

## 2016-01-13 ENCOUNTER — Emergency Department (HOSPITAL_COMMUNITY)
Admission: EM | Admit: 2016-01-13 | Discharge: 2016-01-13 | Disposition: A | Discharge status: Home / Self Care | Payer: No Typology Code available for payment source | Attending: Emergency Medicine | Admitting: Emergency Medicine

## 2016-01-13 ENCOUNTER — Encounter (HOSPITAL_COMMUNITY): Payer: Self-pay | Admitting: Emergency Medicine

## 2016-01-13 DIAGNOSIS — M549 Dorsalgia, unspecified: Secondary | ICD-10-CM | POA: Diagnosis not present

## 2016-01-13 DIAGNOSIS — Y92410 Unspecified street and highway as the place of occurrence of the external cause: Secondary | ICD-10-CM | POA: Insufficient documentation

## 2016-01-13 DIAGNOSIS — R51 Headache: Secondary | ICD-10-CM | POA: Diagnosis not present

## 2016-01-13 DIAGNOSIS — Z79899 Other long term (current) drug therapy: Secondary | ICD-10-CM | POA: Diagnosis not present

## 2016-01-13 DIAGNOSIS — Y999 Unspecified external cause status: Secondary | ICD-10-CM | POA: Insufficient documentation

## 2016-01-13 DIAGNOSIS — Y939 Activity, unspecified: Secondary | ICD-10-CM | POA: Diagnosis not present

## 2016-01-13 DIAGNOSIS — M542 Cervicalgia: Secondary | ICD-10-CM | POA: Diagnosis present
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
## 2016-01-13 NOTE — Discharge Instructions (Signed)
Please read attached information. If you experience any new or worsening signs or symptoms please return to the emergency room for evaluation. Please follow-up with your primary care provider or specialist as discussed.  °

## 2016-04-09 ENCOUNTER — Ambulatory Visit (HOSPITAL_COMMUNITY)
Admission: EM | Admit: 2016-04-09 | Discharge: 2016-04-09 | Disposition: A | Discharge status: Home / Self Care | Payer: Managed Care, Other (non HMO) | Attending: Internal Medicine | Admitting: Internal Medicine

## 2016-04-09 ENCOUNTER — Encounter (HOSPITAL_COMMUNITY): Payer: Self-pay | Admitting: Emergency Medicine

## 2016-04-09 DIAGNOSIS — G43709 Chronic migraine without aura, not intractable, without status migrainosus: Secondary | ICD-10-CM | POA: Diagnosis not present

## 2016-04-09 MED ORDER — DEXAMETHASONE 6 MG PO TABS: 6.0000 mg | ORAL_TABLET | Freq: Two times a day (BID) | ORAL | 0 refills | Status: DC

## 2016-04-09 MED ORDER — ONDANSETRON 4 MG PO TBDP: 4.0000 mg | ORAL_TABLET | Freq: Three times a day (TID) | ORAL | 0 refills | Status: DC | PRN

## 2016-04-09 MED ORDER — KETOROLAC TROMETHAMINE 10 MG PO TABS: 10.0000 mg | ORAL_TABLET | Freq: Four times a day (QID) | ORAL | 0 refills | Status: DC | PRN

## 2016-04-09 NOTE — ED Triage Notes (Signed)
[facility] a [facility] a headache x 1 week. The patient stated that she has a previous hx of migraines.

## 2016-04-09 NOTE — Discharge Instructions (Addendum)
For your headache, I have prescribed Toradol, take 1 tablet every 6 hours as needed for pain. Additionally I have prescribed a steroid called Decadron, 1 tablet twice a day for 2 days. For Nausea, I have prescribed Zofran, take 1 tablet under the tongue every 8 hours as needed. Finally, take over the counter benadryl, 2 tablets or 50 mg every 6 hours not to exceed 300 mg a day.

## 2016-04-09 NOTE — ED Provider Notes (Signed)
CSN: [id number]     Arrival date & time 04/09/16  1001 History   None    Chief Complaint  Patient presents with  . Headache   (Consider location/radiation/quality/duration/timing/severity/associated sxs/prior Treatment) 26 year old female presents to clinic with one week history of headache. States she has a past history of migraine headaches, then she is normally able to control her headaches with ibuprofen, however she has been unable to stop this headache. She states she has taken over-the-counter for the week several ibuprofen, and several BC powders, with minimal relief. She reports she has had nausea, and vomited once yesterday. She's had no fever, no chills, no neck stiffness or neck rigidity, no blurred vision, she does have light sensitivity, and sound sensitivity, and ache is described as a dull, achy pain on the right side. States this is consistent with prior headaches.   The history is provided by the patient.  Headache    Past Medical History:  Diagnosis Date  . Complication of anesthesia    Hives, Rash, Itching  . Medical history non-contributory   . PONV (postoperative nausea and vomiting)    Past Surgical History:  Procedure Laterality Date  . CESAREAN SECTION    . CESAREAN SECTION N/A 06/02/2015   Procedure: REPEAT CESAREAN SECTION;  Surgeon: Brock Bad, MD;  Location: [facility] BIRTHING SUITES;  Service: Obstetrics;  Laterality: N/A;  . DILATION AND CURETTAGE OF UTERUS     Family History  Problem Relation Age of Onset  . Diabetes Mother   . Hypertension Mother   . Stroke Father   . Diabetes Maternal Grandmother   . Hyperlipidemia Maternal Grandmother   . Diabetes Maternal Grandfather    Social History  Substance Use Topics  . Smoking status: Never Smoker  . Smokeless tobacco: Never Used  . Alcohol use 0.0 oz/week     Comment: occ   OB History    Gravida Para Term Preterm AB Living   4 3 3  0 1 3   SAB TAB Ectopic Multiple Live Births   1 0 0 0 3      Review of Systems  Reason unable to perform ROS: as covered in HPI.  Neurological: Positive for headaches.  All other systems reviewed and are negative.   Allergies  Penicillins  Home Medications   Prior to Admission medications   Medication Sig Start Date End Date Taking? Authorizing Provider  levonorgestrel (MIRENA) 20 MCG/24HR IUD 1 each by Intrauterine route once.   Yes Historical Provider, MD  dexamethasone (DECADRON) 6 MG tablet Take 1 tablet (6 mg total) by mouth 2 (two) times daily with a meal. 04/09/16   Dorena Bodo, NP  ketorolac (TORADOL) 10 MG tablet Take 1 tablet (10 mg total) by mouth every 6 (six) hours as needed. 04/09/16   Dorena Bodo, NP  ondansetron (ZOFRAN ODT) 4 MG disintegrating tablet Take 1 tablet (4 mg total) by mouth every 8 (eight) hours as needed for nausea or vomiting. 04/09/16   Dorena Bodo, NP   Meds Ordered and Administered this Visit  Medications - No data to display  BP 109/63 (BP Location: Right Arm)   Pulse 81   Temp 98.3 F (36.8 C) (Oral)   Resp 16   SpO2 99%  No data found.   Physical Exam  Constitutional: She is oriented to person, place, and time. She appears well-developed and well-nourished. No distress.  HENT:  Head: Normocephalic.  Right Ear: External ear normal.  Left Ear: External ear normal.  Mouth/Throat:  Oropharynx is clear and moist.  Eyes: Conjunctivae and EOM are normal. Pupils are equal, round, and reactive to light.  Neck: Normal range of motion. Neck supple. No JVD present.  Cardiovascular: Normal rate and regular rhythm.   Pulmonary/Chest: Effort normal and breath sounds normal.  Abdominal: Soft. Bowel sounds are normal.  Musculoskeletal: She exhibits no edema or tenderness.  Lymphadenopathy:    She has no cervical adenopathy.  Neurological: She is alert and oriented to person, place, and time. No cranial nerve deficit.  Skin: Skin is warm and dry. Capillary refill takes less than 2 seconds. She is  not diaphoretic.  Psychiatric: She has a normal mood and affect. Her behavior is normal.  Nursing note and vitals reviewed.   Urgent Care Course     Procedures (including critical care time)  Labs Review Labs Reviewed - No data to display  Imaging Review No results found.     MDM   1. Chronic migraine without aura without status migrainosus, not intractable    For your headache, I have prescribed Toradol, take 1 tablet every 6 hours as needed for pain. Additionally I have prescribed a steroid called Decadron, 1 tablet twice a day for 2 days. For Nausea, I have prescribed Zofran, take 1 tablet under the tongue every 8 hours as needed. Finally, take over the counter benadryl, 2 tablets or 50 mg every 6 hours not to exceed 300 mg a day.       Dorena BodoLawrence Alvino Lechuga, NP 04/09/16 [phone]

## 2016-04-12 ENCOUNTER — Ambulatory Visit (HOSPITAL_COMMUNITY)
Admission: EM | Admit: 2016-04-12 | Discharge: 2016-04-12 | Disposition: A | Discharge status: Home / Self Care | Payer: Managed Care, Other (non HMO) | Attending: Internal Medicine | Admitting: Internal Medicine

## 2016-04-12 ENCOUNTER — Encounter (HOSPITAL_COMMUNITY): Payer: Self-pay | Admitting: Emergency Medicine

## 2016-04-12 DIAGNOSIS — M542 Cervicalgia: Secondary | ICD-10-CM

## 2016-04-12 DIAGNOSIS — K2900 Acute gastritis without bleeding: Secondary | ICD-10-CM

## 2016-04-12 DIAGNOSIS — G44209 Tension-type headache, unspecified, not intractable: Secondary | ICD-10-CM

## 2016-04-12 DIAGNOSIS — G43101 Migraine with aura, not intractable, with status migrainosus: Secondary | ICD-10-CM

## 2016-04-12 MED ORDER — CYCLOBENZAPRINE HCL 10 MG PO TABS: 10.0000 mg | ORAL_TABLET | Freq: Every day | ORAL | 0 refills | Status: DC

## 2016-04-12 MED ORDER — GI COCKTAIL ~~LOC~~
30.0000 mL | Freq: Once | ORAL | Status: AC
  Administered 2016-04-12: 30 mL via ORAL

## 2016-04-12 MED ORDER — ONDANSETRON 4 MG PO TBDP
ORAL_TABLET | ORAL | Status: AC
  Filled 2016-04-12: qty 1

## 2016-04-12 MED ORDER — OMEPRAZOLE 20 MG PO CPDR: 20.0000 mg | DELAYED_RELEASE_CAPSULE | Freq: Every day | ORAL | 0 refills | Status: DC

## 2016-04-12 MED ORDER — GI COCKTAIL ~~LOC~~
ORAL | Status: AC
  Filled 2016-04-12: qty 30

## 2016-04-12 MED ORDER — CYCLOBENZAPRINE HCL 10 MG PO TABS
10.0000 mg | ORAL_TABLET | Freq: Every day | ORAL | 0 refills | Status: DC
Start: 2016-04-12 — End: 2018-06-04

## 2016-04-12 MED ORDER — SUMATRIPTAN SUCCINATE 100 MG PO TABS: 100.0000 mg | ORAL_TABLET | ORAL | 0 refills | Status: AC | PRN

## 2016-04-12 MED ORDER — ONDANSETRON 4 MG PO TBDP
4.0000 mg | ORAL_TABLET | Freq: Once | ORAL | Status: AC
  Administered 2016-04-12: 4 mg via ORAL

## 2016-04-12 NOTE — ED Provider Notes (Signed)
CSN: [id number]     Arrival date & time 04/12/16  1837 History   None    Chief Complaint  Patient presents with  . Headache   (Consider location/radiation/quality/duration/timing/severity/associated sxs/prior Treatment) Patient states she has been having difficulty with headaches for past 3 weeks. She states she could only afford one rx from last visit.  She c/o stomach discomfort and nausea.   The history is provided by the patient.  Headache  Pain location:  R temporal Quality:  Dull Radiates to:  Eyes Severity currently:  5/10 Severity at highest:  8/10 Onset quality:  Sudden Duration:  3 weeks Timing:  Constant Progression:  Worsening Chronicity:  New Similar to prior headaches: yes   Context: bright light and loud noise   Relieved by:  Nothing Worsened by:  Nothing Ineffective treatments:  None tried Associated symptoms: myalgias, neck pain and neck stiffness     Past Medical History:  Diagnosis Date  . Complication of anesthesia    Hives, Rash, Itching  . Medical history non-contributory   . PONV (postoperative nausea and vomiting)    Past Surgical History:  Procedure Laterality Date  . CESAREAN SECTION    . CESAREAN SECTION N/A 06/02/2015   Procedure: REPEAT CESAREAN SECTION;  Surgeon: Brock Badharles A Harper, MD;  [facility];  Service: Obstetrics;  Laterality: N/A;  . DILATION AND CURETTAGE OF UTERUS     Family History  Problem Relation Age of Onset  . Diabetes Mother   . Hypertension Mother   . Stroke Father   . Diabetes Maternal Grandmother   . Hyperlipidemia Maternal Grandmother   . Diabetes Maternal Grandfather    Social History  Substance Use Topics  . Smoking status: Never Smoker  . Smokeless tobacco: Never Used  . Alcohol use 0.0 oz/week     Comment: occ   OB History    Gravida Para Term Preterm AB Living   4 3 3  0 1 3   SAB TAB Ectopic Multiple Live Births   1 0 0 0 3     Review of Systems  Constitutional: Negative.    HENT: Negative.   Eyes: Negative.   Respiratory: Negative.   Cardiovascular: Negative.   Gastrointestinal: Negative.   Endocrine: Negative.   Genitourinary: Negative.   Musculoskeletal: Positive for arthralgias, myalgias, neck pain and neck stiffness.  Allergic/Immunologic: Negative.   Neurological: Positive for headaches.  Hematological: Negative.   Psychiatric/Behavioral: Negative.     Allergies  Penicillins  Home Medications   Prior to Admission medications   Medication Sig Start Date End Date Taking? Authorizing Provider  ketorolac (TORADOL) 10 MG tablet Take 1 tablet (10 mg total) by mouth every 6 (six) hours as needed. 04/09/16  Yes Dorena BodoLawrence Kennard, NP  levonorgestrel (MIRENA) 20 MCG/24HR IUD 1 each by Intrauterine route once.   Yes Historical Provider, MD  cyclobenzaprine (FLEXERIL) 10 MG tablet Take 1 tablet (10 mg total) by mouth at bedtime. 04/12/16   Deatra CanterWilliam J Oxford, FNP  dexamethasone (DECADRON) 6 MG tablet Take 1 tablet (6 mg total) by mouth 2 (two) times daily with a meal. 04/09/16   Dorena BodoLawrence Kennard, NP  omeprazole (PRILOSEC) 20 MG capsule Take 1 capsule (20 mg total) by mouth daily. 04/12/16   Deatra CanterWilliam J Oxford, FNP  ondansetron (ZOFRAN ODT) 4 MG disintegrating tablet Take 1 tablet (4 mg total) by mouth every 8 (eight) hours as needed for nausea or vomiting. 04/09/16   Dorena BodoLawrence Kennard, NP  SUMAtriptan (IMITREX) 100 MG tablet Take  1 tablet (100 mg total) by mouth every 2 (two) hours as needed for migraine. May repeat in 2 hours if headache persists or recurs. 04/12/16   Deatra Canter, FNP   Meds Ordered and Administered this Visit   Medications  ondansetron (ZOFRAN-ODT) disintegrating tablet 4 mg (4 mg Oral Given 04/12/16 1931)  gi cocktail (Maalox,Lidocaine,Donnatal) (30 mLs Oral Given 04/12/16 1930)    BP 112/67 (BP Location: Left Arm)   Pulse (!) 58   Temp 99 F (37.2 C) (Oral)   Resp 16   SpO2 100%   Breastfeeding? No  No data found.   Physical Exam   Constitutional: She is oriented to person, place, and time. She appears well-developed and well-nourished.  HENT:  Head: Normocephalic and atraumatic.  Right Ear: External ear normal.  Left Ear: External ear normal.  Mouth/Throat: Oropharynx is clear and moist.  Eyes: Conjunctivae and EOM are normal. Pupils are equal, round, and reactive to light.  Neck: Normal range of motion. Neck supple.  Cardiovascular: Normal rate, regular rhythm and normal heart sounds.   Pulmonary/Chest: Effort normal and breath sounds normal.  Abdominal: Soft. Bowel sounds are normal. There is tenderness.  Epigastric tenderness.  Musculoskeletal: She exhibits tenderness.  TTP right cervical paraspinous muscles  Neurological: She is alert and oriented to person, place, and time.  Nursing note and vitals reviewed.   Urgent Care Course     Procedures (including critical care time)  Labs Review Labs Reviewed - No data to display  Imaging Review No results found.   Visual Acuity Review  Right Eye Distance:   Left Eye Distance:   Bilateral Distance:    Right Eye Near:   Left Eye Near:    Bilateral Near:         MDM   1. Tension headache   2. Migraine with aura and with status migrainosus, not intractable   3. Cervicalgia   4. Other acute gastritis without hemorrhage    GI cocktail now Zofran ODT 4mg  now Prilosec 20mg  one po qd #14 Imitrex 100mg  one po with onset of headache and repeat in 2 hours prn #10 Flexeril 10mg  One po qhs prn #20      Deatra Canter, FNP 04/12/16 1941

## 2016-04-12 NOTE — ED Triage Notes (Signed)
Here for persistent HA onset 1 week +++   Seen here on 3/13 for similar sx  Sx also include: vomiting, decreased appetite  A&O x4... NAD... Speaking in complete sentences.

## 2016-07-24 ENCOUNTER — Encounter (HOSPITAL_COMMUNITY): Payer: Self-pay | Admitting: *Deleted

## 2016-07-24 ENCOUNTER — Ambulatory Visit (HOSPITAL_COMMUNITY)
Admission: EM | Admit: 2016-07-24 | Discharge: 2016-07-24 | Disposition: A | Discharge status: Home / Self Care | Payer: Self-pay | Attending: Family Medicine | Admitting: Family Medicine

## 2016-07-24 DIAGNOSIS — Z880 Allergy status to penicillin: Secondary | ICD-10-CM | POA: Insufficient documentation

## 2016-07-24 DIAGNOSIS — N760 Acute vaginitis: Secondary | ICD-10-CM | POA: Insufficient documentation

## 2016-07-24 DIAGNOSIS — R300 Dysuria: Secondary | ICD-10-CM | POA: Insufficient documentation

## 2016-07-24 DIAGNOSIS — N898 Other specified noninflammatory disorders of vagina: Secondary | ICD-10-CM

## 2016-07-24 DIAGNOSIS — B9689 Other specified bacterial agents as the cause of diseases classified elsewhere: Secondary | ICD-10-CM | POA: Insufficient documentation

## 2016-07-24 DIAGNOSIS — Z79899 Other long term (current) drug therapy: Secondary | ICD-10-CM | POA: Insufficient documentation

## 2016-07-24 LAB — POCT URINALYSIS DIP (DEVICE)
BILIRUBIN URINE: NEGATIVE
Glucose, UA: NEGATIVE mg/dL
Hgb urine dipstick: NEGATIVE
KETONES UR: NEGATIVE mg/dL
Nitrite: NEGATIVE
PROTEIN: NEGATIVE mg/dL
SPECIFIC GRAVITY, URINE: 1.02 (ref 1.005–1.030)
Urobilinogen, UA: 0.2 mg/dL (ref 0.0–1.0)
pH: 7.5 (ref 5.0–8.0)

## 2016-07-24 MED ORDER — NITROFURANTOIN MONOHYD MACRO 100 MG PO CAPS: 100.0000 mg | ORAL_CAPSULE | Freq: Two times a day (BID) | ORAL | 0 refills | Status: DC

## 2016-07-24 MED ORDER — METRONIDAZOLE 500 MG PO TABS: 500.0000 mg | ORAL_TABLET | Freq: Two times a day (BID) | ORAL | 0 refills | Status: DC

## 2016-07-24 NOTE — ED Triage Notes (Signed)
Vaginal   discharge   With  Odor   X   sev   Days   Slight  Pain  When  Urinates    Also  Reports    Was bitten  By mosquito      sev  Days  Ago

## 2016-07-24 NOTE — ED Provider Notes (Signed)
CSN: [id number]     Arrival date & time 07/24/16  1019 History   None    Chief Complaint  Patient presents with  . Vaginal Discharge   (Consider location/radiation/quality/duration/timing/severity/associated sxs/prior Treatment) Patient c/o vaginal discharge with odor for several days    Vaginal Discharge    Past Medical History:  Diagnosis Date  . Complication of anesthesia    Hives, Rash, Itching  . Medical history non-contributory   . PONV (postoperative nausea and vomiting)    Past Surgical History:  Procedure Laterality Date  . CESAREAN SECTION    . CESAREAN SECTION N/A 06/02/2015   Procedure: REPEAT CESAREAN SECTION;  Surgeon: Brock Badharles A Harper, MD;  [facility];  Service: Obstetrics;  Laterality: N/A;  . DILATION AND CURETTAGE OF UTERUS     Family History  Problem Relation Age of Onset  . Diabetes Mother   . Hypertension Mother   . Stroke Father   . Diabetes Maternal Grandmother   . Hyperlipidemia Maternal Grandmother   . Diabetes Maternal Grandfather    Social History  Substance Use Topics  . Smoking status: Never Smoker  . Smokeless tobacco: Never Used  . Alcohol use 0.0 oz/week     Comment: occ   OB History    Gravida Para Term Preterm AB Living   4 3 3  0 1 3   SAB TAB Ectopic Multiple Live Births   1 0 0 0 3     Review of Systems  Constitutional: Negative.   HENT: Negative.   Eyes: Negative.   Respiratory: Negative.   Cardiovascular: Negative.   Gastrointestinal: Negative.   Endocrine: Negative.   Genitourinary: Positive for vaginal discharge.  Musculoskeletal: Negative.   Allergic/Immunologic: Negative.   Neurological: Negative.   Hematological: Negative.   Psychiatric/Behavioral: Negative.     Allergies  Penicillins  Home Medications   Prior to Admission medications   Medication Sig Start Date End Date Taking? Authorizing Provider  cyclobenzaprine (FLEXERIL) 10 MG tablet Take 1 tablet (10 mg total) by mouth at  bedtime. 04/12/16   Deatra Canterxford, William J, FNP  dexamethasone (DECADRON) 6 MG tablet Take 1 tablet (6 mg total) by mouth 2 (two) times daily with a meal. 04/09/16   Dorena BodoKennard, Lawrence, NP  ketorolac (TORADOL) 10 MG tablet Take 1 tablet (10 mg total) by mouth every 6 (six) hours as needed. 04/09/16   Dorena BodoKennard, Lawrence, NP  levonorgestrel (MIRENA) 20 MCG/24HR IUD 1 each by Intrauterine route once.    [provider]  metroNIDAZOLE (FLAGYL) 500 MG tablet Take 1 tablet (500 mg total) by mouth 2 (two) times daily. 07/24/16   Deatra Canterxford, William J, FNP  nitrofurantoin, macrocrystal-monohydrate, (MACROBID) 100 MG capsule Take 1 capsule (100 mg total) by mouth 2 (two) times daily. 07/24/16   Deatra Canterxford, William J, FNP  omeprazole (PRILOSEC) 20 MG capsule Take 1 capsule (20 mg total) by mouth daily. 04/12/16   Deatra Canterxford, William J, FNP  ondansetron (ZOFRAN ODT) 4 MG disintegrating tablet Take 1 tablet (4 mg total) by mouth every 8 (eight) hours as needed for nausea or vomiting. 04/09/16   Dorena BodoKennard, Lawrence, NP  SUMAtriptan (IMITREX) 100 MG tablet Take 1 tablet (100 mg total) by mouth every 2 (two) hours as needed for migraine. May repeat in 2 hours if headache persists or recurs. 04/12/16   Deatra Canterxford, William J, FNP   Meds Ordered and Administered this Visit  Medications - No data to display  BP 110/70 (BP Location: Right Arm)   Pulse 78  Temp 98.6 F (37 C) (Oral)   Resp 18  No data found.   Physical Exam  Constitutional: She appears well-developed and well-nourished.  HENT:  Head: Normocephalic and atraumatic.  Eyes: Conjunctivae and EOM are normal. Pupils are equal, round, and reactive to light.  Neck: Normal range of motion. Neck supple.  Cardiovascular: Normal rate, regular rhythm and normal heart sounds.   Pulmonary/Chest: Effort normal and breath sounds normal.  Nursing note and vitals reviewed.   Urgent Care Course     Procedures (including critical care time)  Labs Review Labs Reviewed  POCT  URINALYSIS DIP (DEVICE) - Abnormal; Notable for the following:       Result Value   Leukocytes, UA TRACE (*)    All other components within normal limits  URINE CULTURE  URINE CYTOLOGY ANCILLARY ONLY    Imaging Review No results found.   Visual Acuity Review  Right Eye Distance:   Left Eye Distance:   Bilateral Distance:    Right Eye Near:   Left Eye Near:    Bilateral Near:         MDM   1. BV (bacterial vaginosis)   2. Vaginal discharge   3. Dysuria    Macrobid 100mg  one po bid x 5 days 310 Flagyl 500mg  one po bid x 7 days #14  UA CX      Deatra Canter, FNP 07/24/16 1102

## 2016-07-25 LAB — URINE CULTURE: Culture: NO GROWTH

## 2016-07-25 LAB — URINE CYTOLOGY ANCILLARY ONLY
Chlamydia: NEGATIVE
Neisseria Gonorrhea: NEGATIVE
Trichomonas: NEGATIVE

## 2016-07-26 LAB — URINE CYTOLOGY ANCILLARY ONLY
Bacterial vaginitis: POSITIVE — AB
Candida vaginitis: NEGATIVE

## 2016-10-10 ENCOUNTER — Encounter (HOSPITAL_COMMUNITY): Payer: Self-pay | Admitting: Family Medicine

## 2016-10-10 ENCOUNTER — Ambulatory Visit (HOSPITAL_COMMUNITY)
Admission: EM | Admit: 2016-10-10 | Discharge: 2016-10-10 | Disposition: A | Discharge status: Home / Self Care | Payer: 59 | Attending: Family Medicine | Admitting: Family Medicine

## 2016-10-10 DIAGNOSIS — B9689 Other specified bacterial agents as the cause of diseases classified elsewhere: Secondary | ICD-10-CM | POA: Insufficient documentation

## 2016-10-10 DIAGNOSIS — N760 Acute vaginitis: Secondary | ICD-10-CM | POA: Insufficient documentation

## 2016-10-10 MED ORDER — METRONIDAZOLE 500 MG PO TABS: 500.0000 mg | ORAL_TABLET | Freq: Two times a day (BID) | ORAL | 0 refills | Status: DC

## 2016-10-10 NOTE — ED Triage Notes (Signed)
Pt here for re occurrence of BV.

## 2016-10-11 ENCOUNTER — Telehealth (HOSPITAL_COMMUNITY): Payer: Self-pay | Admitting: Internal Medicine

## 2016-10-11 LAB — CERVICOVAGINAL ANCILLARY ONLY
BACTERIAL VAGINITIS: NEGATIVE
Candida vaginitis: POSITIVE — AB
Chlamydia: NEGATIVE
Neisseria Gonorrhea: NEGATIVE
Trichomonas: NEGATIVE

## 2016-10-11 MED ORDER — FLUCONAZOLE 150 MG PO TABS: 150.0000 mg | ORAL_TABLET | Freq: Once | ORAL | 0 refills | Status: AC

## 2016-10-11 NOTE — Telephone Encounter (Signed)
Clinical staff, please let patient know that test for candida (yeast) was positive.  Rx fluconazole was sent to the pharmacy of record, [facility] at [facility] and [address].  Recheck for further evaluation if symptoms are not improving.  LM

## 2016-10-15 NOTE — ED Provider Notes (Signed)
  [facility]   [id number] 10/10/16 Arrival Time: 1749  ASSESSMENT & PLAN:  Today you were diagnosed with the following: 1. BV (bacterial vaginosis)     Meds ordered this encounter  Medications  . metroNIDAZOLE (FLAGYL) 500 MG tablet    Sig: Take 1 tablet (500 mg total) by mouth 2 (two) times daily.    Dispense:  14 tablet    Refill:  0   Urine cytology sent. Reviewed expectations re: course of current medical issues. Questions answered. Outlined signs and symptoms indicating need for more acute intervention. Patient verbalized understanding. After Visit Summary given.   SUBJECTIVE:  Jenny Randolph is a 26 y.o. female who presents with complaint of bacterial vaginosis. Gets regularly and responds to Flagyl. No vaginal bleeding. Afebrile. No n/v. No pelvic pain. D/C is thin with fishy odor. Present for several days. No vaginal itching or rashes.  No LMP recorded. Patient is not currently having periods (Reason: IUD).  ROS: As per HPI.  OBJECTIVE:  Vitals:   10/10/16 1827  BP: 110/60  Pulse: 60  Resp: 16  Temp: 98.3 F (36.8 C)  TempSrc: Oral  SpO2: 100%     General appearance: alert, cooperative, appears stated age and no distress Throat: lips, mucosa, and tongue normal; teeth and gums normal Back: no CVA tenderness Abdomen: soft, non-tender; bowel sounds normal; no masses or organomegaly; no guarding or rebound tenderness GU: declines Skin: warm and dry Psychological:  Alert and cooperative. Normal mood and affect.   No results found.  Allergies  Allergen Reactions  . Penicillins Hives    The whole -cillin family.  Has patient had a PCN reaction causing immediate rash, facial/tongue/throat swelling, SOB or lightheadedness with hypotension: yes Has patient had a PCN reaction causing severe rash involving mucus membranes or skin necrosis: unknown Has patient had a PCN reaction that required hospitalization pt was in hospital when reaction  occured Has patient had a PCN reaction occurring within the last 10 years: No If all of the above answers are "NO", then may proceed with Cephalospo    Past Medical History:  Diagnosis Date  . Complication of anesthesia    Hives, Rash, Itching  . Medical history non-contributory   . PONV (postoperative nausea and vomiting)    Family History  Problem Relation Age of Onset  . Diabetes Mother   . Hypertension Mother   . Stroke Father   . Diabetes Maternal Grandmother   . Hyperlipidemia Maternal Grandmother   . Diabetes Maternal Grandfather    Social History   Social History  . Marital status: Single    Spouse name: N/A  . Number of children: N/A  . Years of education: N/A   Occupational History  . Not on file.   Social History Main Topics  . Smoking status: Never Smoker  . Smokeless tobacco: Never Used  . Alcohol use 0.0 oz/week     Comment: occ  . Drug use: No  . Sexual activity: Yes    Partners: Male    Birth control/ protection: IUD   Other Topics Concern  . Not on file   Social History Narrative  . No narrative on file          Mardella Layman, MD 10/15/16 1004

## 2016-11-05 IMAGING — US US TRANSVAGINAL NON-OB
1 series · 15 of 25 positions shown · non-contrast
Comparison: None

CLINICAL DATA: Left lower quadrant pain following IUD placement.
LMP 09/14/2015.

EXAM:
TRANSABDOMINAL AND TRANSVAGINAL ULTRASOUND OF PELVIS
TECHNIQUE: Both transabdominal and transvaginal ultrasound examinations of the
pelvis were performed. Transabdominal technique was performed for
global imaging of the pelvis including uterus, ovaries, adnexal
regions, and pelvic cul-de-sac. It was necessary to proceed with
endovaginal exam following the transabdominal exam to visualize the
IUD and ovaries.

[Series 1: us transvaginal non-ob · 15 of 105 slices shown]
[im 1/105]
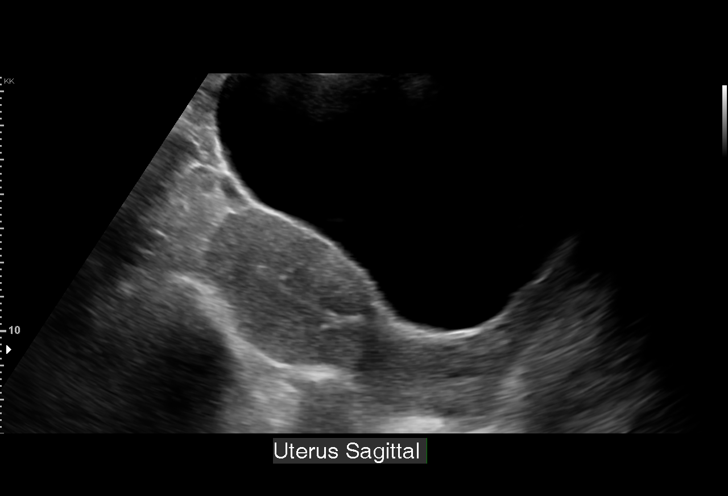
[im 9/105]
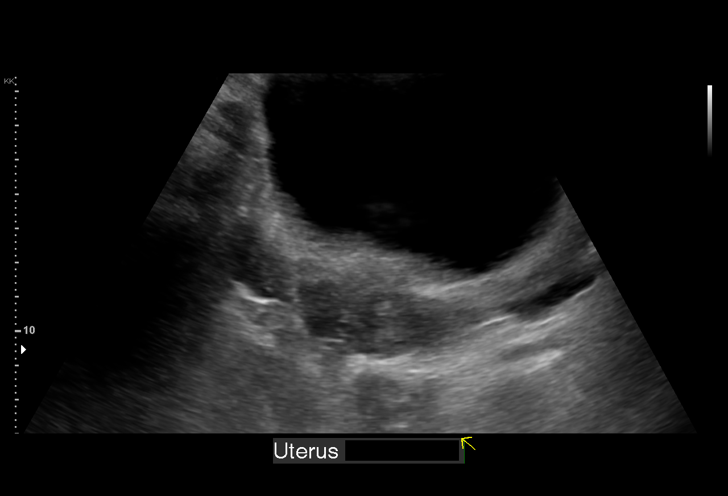
[im 18/105]
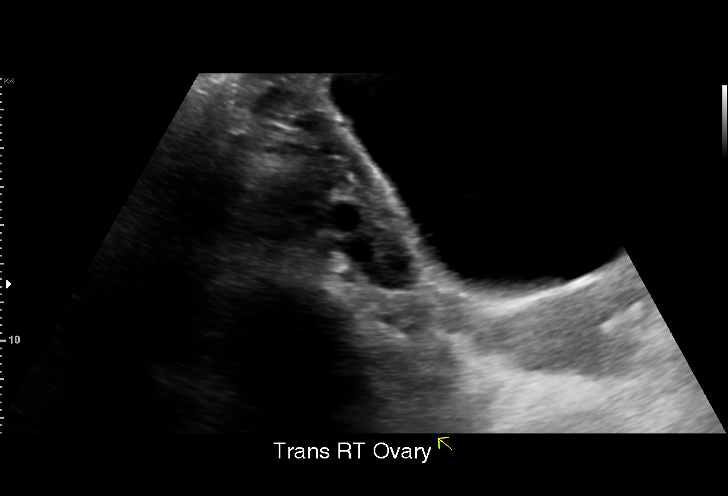
[im 22/105]
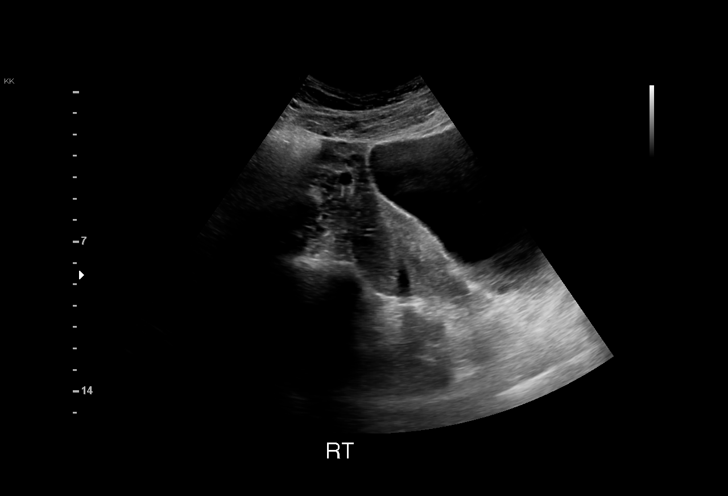
[im 31/105]
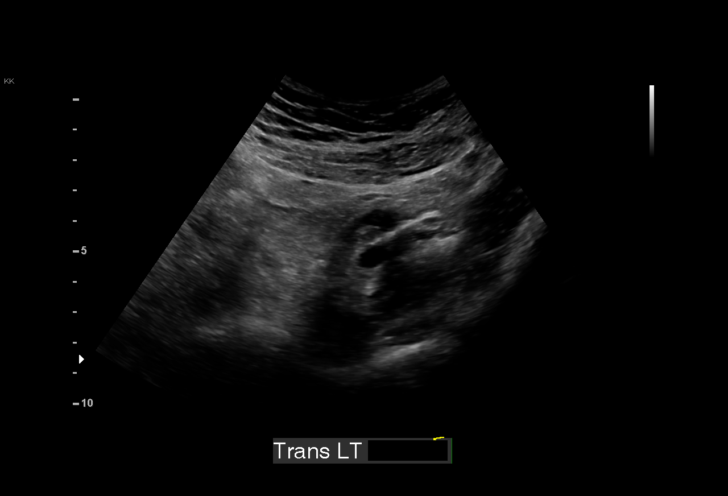
[im 40/105]
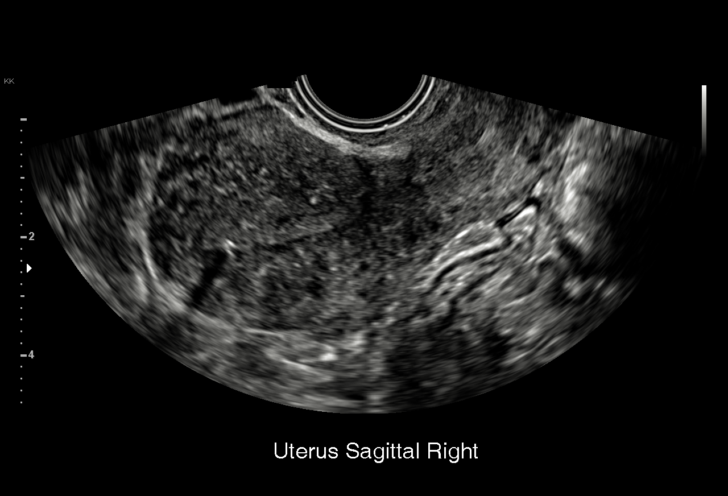
[im 44/105]
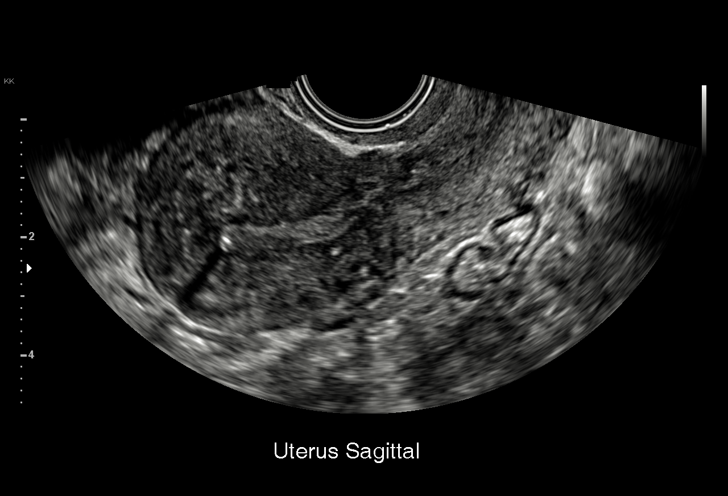
[im 53/105]
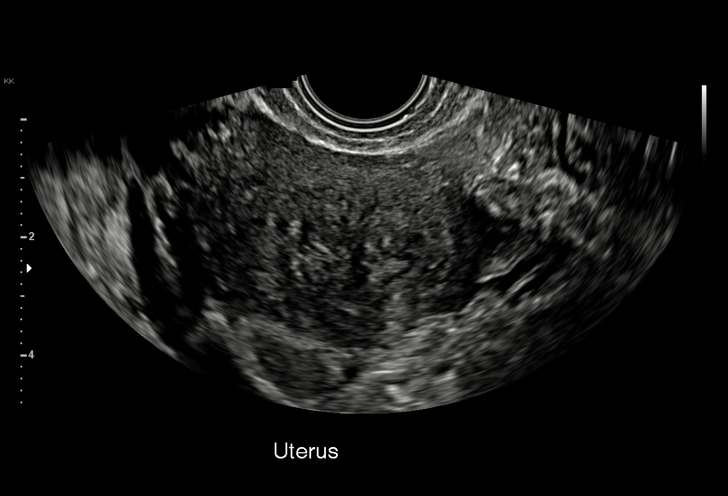
[im 61/105]
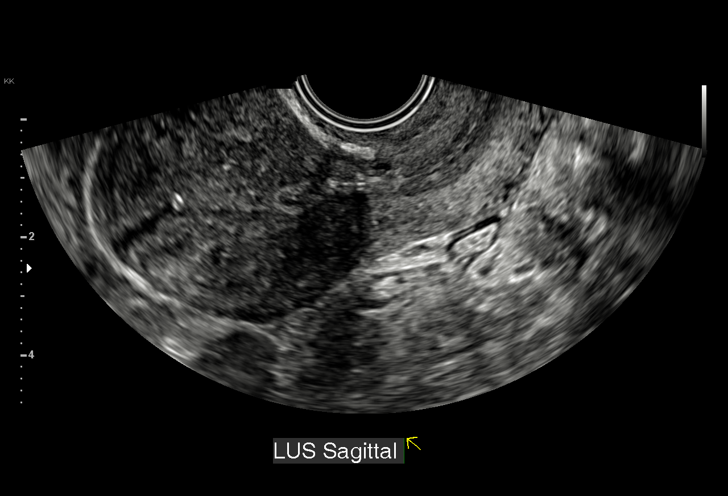
[im 66/105]
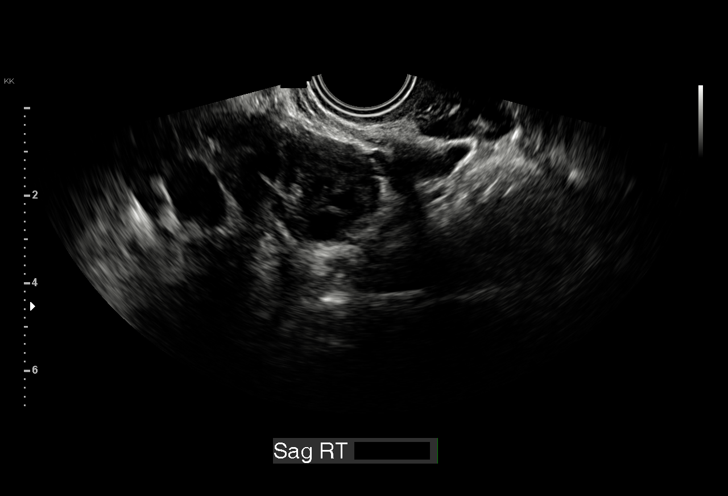
[im 74/105]
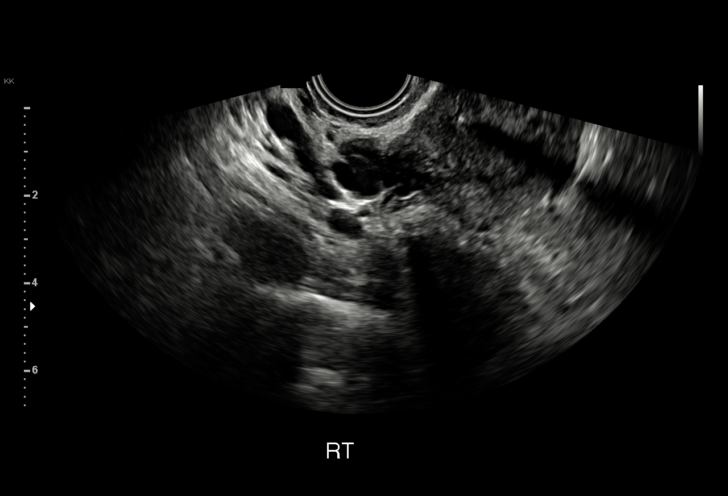
[im 83/105]
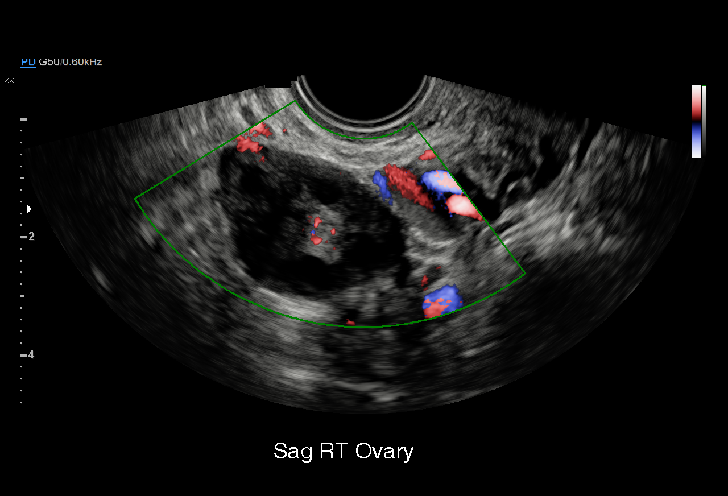
[im 87/105]
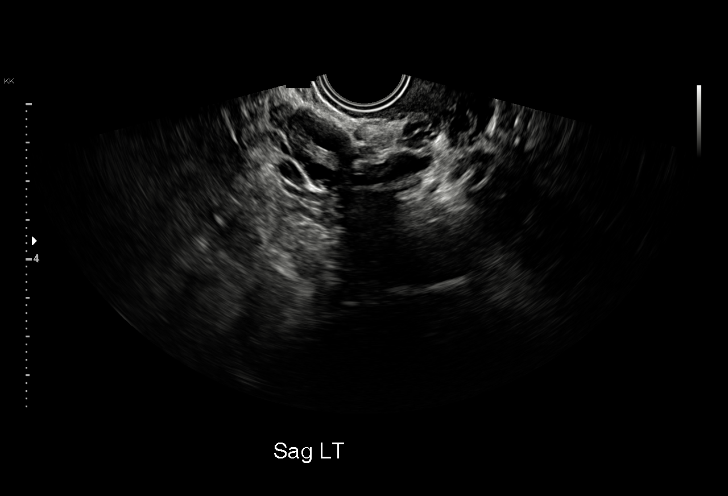
[im 96/105]
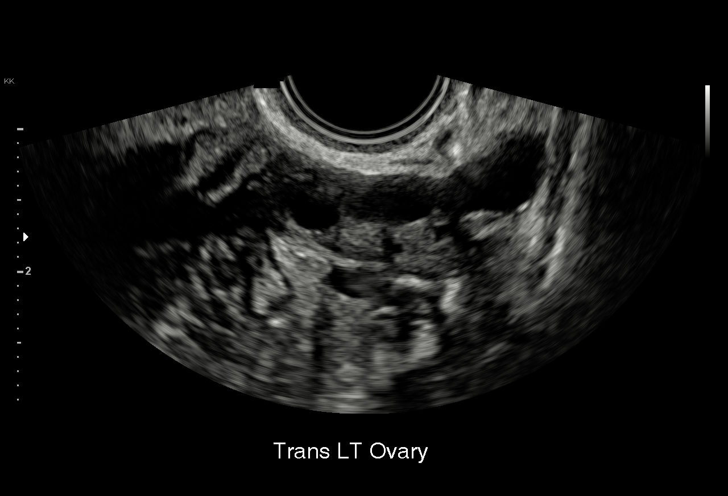
[im 105/105]
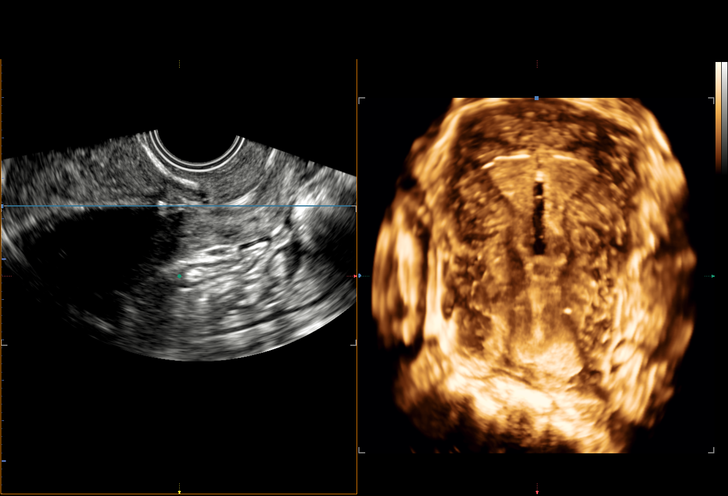

[15 of 25 positions shown; findings below may reference images not displayed]

FINDINGS: Uterus

Measurements: 7.8 x 3.9 x 5.1 cm. No fibroids or other mass
visualized.

Endometrium

Thickness: 3 mm. IUD seen in appropriate position within endometrial
cavity, which is confirmed with 3D imaging.

Right ovary

Measurements: 3.6 x 2.3 x 2.1 cm. Normal appearance/no adnexal mass.

Left ovary

Measurements: 2.5 x 1.5 x 3.4 cm. Normal appearance/no adnexal mass.

Other findings

No abnormal free fluid.
IMPRESSION: Normal appearance of uterus. IUD in appropriate position within
endometrial cavity.

Normal appearance of both ovaries.  No adnexal mass identified .

## 2016-12-23 ENCOUNTER — Ambulatory Visit (INDEPENDENT_AMBULATORY_CARE_PROVIDER_SITE_OTHER): Payer: 59 | Admitting: Obstetrics

## 2016-12-23 ENCOUNTER — Encounter: Payer: Self-pay | Admitting: Obstetrics

## 2016-12-23 VITALS — BP 124/76 | HR 71 | Ht 61.0 in | Wt 143.3 lb

## 2016-12-23 DIAGNOSIS — Z113 Encounter for screening for infections with a predominantly sexual mode of transmission: Secondary | ICD-10-CM | POA: Diagnosis not present

## 2016-12-23 DIAGNOSIS — Z23 Encounter for immunization: Secondary | ICD-10-CM | POA: Diagnosis not present

## 2016-12-23 DIAGNOSIS — Z124 Encounter for screening for malignant neoplasm of cervix: Secondary | ICD-10-CM

## 2016-12-23 DIAGNOSIS — Z01419 Encounter for gynecological examination (general) (routine) without abnormal findings: Secondary | ICD-10-CM | POA: Diagnosis not present

## 2016-12-23 DIAGNOSIS — N898 Other specified noninflammatory disorders of vagina: Secondary | ICD-10-CM

## 2016-12-23 MED ORDER — METRONIDAZOLE 500 MG PO TABS: 500.0000 mg | ORAL_TABLET | Freq: Two times a day (BID) | ORAL | 5 refills | Status: DC

## 2016-12-23 NOTE — Progress Notes (Signed)
Subjective:        Jenny Randolph is a 26 y.o. female here for a routine exam.  Current complaints: Recurrent BV.    Personal health questionnaire:  Is patient Jenny Randolph, have a family history of breast and/or ovarian cancer: no Is there a family history of uterine cancer diagnosed at age < 750, gastrointestinal cancer, urinary tract cancer, family member who is a Personnel officerLynch syndrome-associated carrier: no Is the patient overweight and hypertensive, family history of diabetes, personal history of gestational diabetes, preeclampsia or PCOS: no Is patient over 4355, have PCOS,  family history of premature CHD under age 26, diabetes, smoke, have hypertension or peripheral artery disease:  no At any time, has a partner hit, kicked or otherwise hurt or frightened you?: no Over the past 2 weeks, have you felt down, depressed or hopeless?: no Over the past 2 weeks, have you felt little interest or pleasure in doing things?:no   Gynecologic History No LMP recorded. Patient is not currently having periods (Reason: IUD). Contraception: IUD Last Pap: 2017. Results were: normal Last mammogram: n/a. Results were: n/a  Obstetric History OB History  Gravida Para Term Preterm AB Living  4 3 3  0 1 3  SAB TAB Ectopic Multiple Live Births  1 0 0 0 3    # Outcome Date GA Lbr Len/2nd Weight Sex Delivery Anes PTL Lv  4 Term 06/02/15 3031w4d  7 lb 5.8 oz (3.34 kg) M CS-LTranv Spinal  LIV  3 Term 02/06/11 516w0d  6 lb 10 oz (3.005 kg) F CS-LTranv Spinal N LIV  2 Term 05/18/08 8953w0d  6 lb 15 oz (3.147 kg) F CS-LTranv EPI N LIV  1 SAB 2009             Birth Comments: Dilation and curretage      Past Medical History:  Diagnosis Date  . Complication of anesthesia    Hives, Rash, Itching  . Medical history non-contributory   . PONV (postoperative nausea and vomiting)     Past Surgical History:  Procedure Laterality Date  . CESAREAN SECTION    . CESAREAN SECTION N/A 06/02/2015   Procedure:  REPEAT CESAREAN SECTION;  Surgeon: Brock Badharles A Harper, MD;  [facility];  Service: Obstetrics;  Laterality: N/A;  . CESAREAN SECTION    . DILATION AND CURETTAGE OF UTERUS       Current Outpatient Medications:  .  levonorgestrel (MIRENA) 20 MCG/24HR IUD, 1 each by Intrauterine route once., Disp: , Rfl:  .  cyclobenzaprine (FLEXERIL) 10 MG tablet, Take 1 tablet (10 mg total) by mouth at bedtime. (Patient not taking: Reported on 12/23/2016), Disp: 20 tablet, Rfl: 0 .  dexamethasone (DECADRON) 6 MG tablet, Take 1 tablet (6 mg total) by mouth 2 (two) times daily with a meal. (Patient not taking: Reported on 12/23/2016), Disp: 2 tablet, Rfl: 0 .  ketorolac (TORADOL) 10 MG tablet, Take 1 tablet (10 mg total) by mouth every 6 (six) hours as needed. (Patient not taking: Reported on 12/23/2016), Disp: 20 tablet, Rfl: 0 .  metroNIDAZOLE (FLAGYL) 500 MG tablet, Take 1 tablet (500 mg total) by mouth 2 (two) times daily. (Patient not taking: Reported on 12/23/2016), Disp: 14 tablet, Rfl: 0 .  nitrofurantoin, macrocrystal-monohydrate, (MACROBID) 100 MG capsule, Take 1 capsule (100 mg total) by mouth 2 (two) times daily. (Patient not taking: Reported on 12/23/2016), Disp: 10 capsule, Rfl: 0 .  omeprazole (PRILOSEC) 20 MG capsule, Take 1 capsule (20 mg total) by  mouth daily. (Patient not taking: Reported on 12/23/2016), Disp: 14 capsule, Rfl: 0 .  ondansetron (ZOFRAN ODT) 4 MG disintegrating tablet, Take 1 tablet (4 mg total) by mouth every 8 (eight) hours as needed for nausea or vomiting. (Patient not taking: Reported on 12/23/2016), Disp: 20 tablet, Rfl: 0 .  SUMAtriptan (IMITREX) 100 MG tablet, Take 1 tablet (100 mg total) by mouth every 2 (two) hours as needed for migraine. May repeat in 2 hours if headache persists or recurs. (Patient not taking: Reported on 12/23/2016), Disp: 10 tablet, Rfl: 0 Allergies  Allergen Reactions  . Penicillins Hives    The whole -cillin family.  Has patient had a  PCN reaction causing immediate rash, facial/tongue/throat swelling, SOB or lightheadedness with hypotension: yes Has patient had a PCN reaction causing severe rash involving mucus membranes or skin necrosis: unknown Has patient had a PCN reaction that required hospitalization pt was in hospital when reaction occured Has patient had a PCN reaction occurring within the last 10 years: No If all of the above answers are "NO", then may proceed with Cephalospo    Social History   Tobacco Use  . Smoking status: Never Smoker  . Smokeless tobacco: Never Used  Substance Use Topics  . Alcohol use: Yes    Alcohol/week: 0.0 oz    Comment: occ    Family History  Problem Relation Age of Onset  . Diabetes Mother   . Hypertension Mother   . Stroke Father   . Diabetes Maternal Grandmother   . Hyperlipidemia Maternal Grandmother   . Diabetes Maternal Grandfather       Review of Systems  Constitutional: negative for fatigue and weight loss Respiratory: negative for cough and wheezing Cardiovascular: negative for chest pain, fatigue and palpitations Gastrointestinal: negative for abdominal pain and change in bowel habits Musculoskeletal:negative for myalgias Neurological: negative for gait problems and tremors Behavioral/Psych: negative for abusive relationship, depression Endocrine: negative for temperature intolerance    Genitourinary:positive for malodorous vaginal discharge Integument/breast: negative for breast lump, breast tenderness, nipple discharge and skin lesion(s)    Objective:       BP 124/76   Pulse 71   Ht 5\' 1"  (1.549 m)   Wt 143 lb 4.8 oz (65 kg)   BMI 27.08 kg/m  General:   alert  Skin:   no rash or abnormalities  Lungs:   clear to auscultation bilaterally  Heart:   regular rate and rhythm, S1, S2 normal, no murmur, click, rub or gallop  Breasts:   normal without suspicious masses, skin or nipple changes or axillary nodes  Abdomen:  normal findings: no  organomegaly, soft, non-tender and no hernia  Pelvis:  External genitalia: normal general appearance Urinary system: urethral meatus normal and bladder without fullness, nontender Vaginal: normal without tenderness, induration or masses Cervix: normal appearance Adnexa: normal bimanual exam Uterus: anteverted and non-tender, normal size   Lab Review Urine pregnancy test Labs reviewed yes Radiologic studies reviewed no  50% of 20 min visit spent on counseling and coordination of care.    Assessment:     1. Encounter for gynecological examination with Papanicolaou smear of cervix Rx: - Cytology - PAP  2. Vaginal discharge Rx: - Cervicovaginal ancillary only  3. Encounter for immunization Rx: - Flu Vaccine QUAD 36+ mos IM (Fluarix, Quad PF)   Plan:    Education reviewed: calcium supplements, depression evaluation, low fat, low cholesterol diet, safe sex/STD prevention, self breast exams and weight bearing exercise. Contraception: IUD. Follow up  in: 1 year.   No orders of the defined types were placed in this encounter.  Orders Placed This Encounter  Procedures  . Flu Vaccine QUAD 36+ mos IM (Fluarix, Quad PF)

## 2016-12-23 NOTE — Progress Notes (Signed)
Patient is in the office for annual, last pap 12-18-15

## 2016-12-24 LAB — CERVICOVAGINAL ANCILLARY ONLY
BACTERIAL VAGINITIS: NEGATIVE
CANDIDA VAGINITIS: NEGATIVE
Chlamydia: NEGATIVE
Neisseria Gonorrhea: NEGATIVE
Trichomonas: NEGATIVE

## 2016-12-24 LAB — CYTOLOGY - PAP: DIAGNOSIS: NEGATIVE

## 2018-06-04 ENCOUNTER — Encounter (HOSPITAL_COMMUNITY): Payer: Self-pay | Admitting: Emergency Medicine

## 2018-06-04 ENCOUNTER — Other Ambulatory Visit: Payer: Self-pay

## 2018-06-04 ENCOUNTER — Ambulatory Visit (HOSPITAL_COMMUNITY)
Admission: EM | Admit: 2018-06-04 | Discharge: 2018-06-04 | Disposition: A | Discharge status: Home / Self Care | Payer: 59 | Attending: Internal Medicine | Admitting: Internal Medicine

## 2018-06-04 DIAGNOSIS — B352 Tinea manuum: Secondary | ICD-10-CM | POA: Diagnosis not present

## 2018-06-04 MED ORDER — TERBINAFINE HCL 1 % EX CREA: 1.0000 "application " | TOPICAL_CREAM | Freq: Two times a day (BID) | CUTANEOUS | 0 refills | Status: AC

## 2018-06-04 NOTE — ED Provider Notes (Signed)
[facility]    CSN: [id number] Arrival date & time: 06/04/18  0849     History   Chief Complaint Chief Complaint  Patient presents with  . Sore Throat    HPI Jenny Randolph is a 28 y.o. female with no past medical history comes to urgent care with complaints of itchy rash on the hands of a couple of weeks duration.  Symptoms started a couple of weeks ago and is been getting progressively worse.  She has tried over-the-counter steroid cream with no improvement in the rash.  No known aggravating factors.  Not associated with pain in the hands.  Patient also complains of sore throat of 3 days duration.  No relieving factors.  She denies any fever or chills.  Pain is aggravated by swallowing both solids and liquids.   No nausea or vomiting.  No shortness of breath or fever.  No sick contacts.  HPI  Past Medical History:  Diagnosis Date  . Complication of anesthesia    Hives, Rash, Itching  . Medical history non-contributory   . PONV (postoperative nausea and vomiting)     Patient Active Problem List   Diagnosis Date Noted  . Cesarean section wound complication 06/02/2015  . Masses of both breasts 06/21/2014    Past Surgical History:  Procedure Laterality Date  . CESAREAN SECTION    . CESAREAN SECTION N/A 06/02/2015   Procedure: REPEAT CESAREAN SECTION;  Surgeon: Brock Badharles A Harper, MD;  [facility];  Service: Obstetrics;  Laterality: N/A;  . CESAREAN SECTION    . DILATION AND CURETTAGE OF UTERUS      OB History    Gravida  4   Para  3   Term  3   Preterm  0   AB  1   Living  3     SAB  1   TAB  0   Ectopic  0   Multiple  0   Live Births  3            Home Medications    Prior to Admission medications   Medication Sig Start Date End Date Taking? Authorizing Provider  SUMAtriptan (IMITREX) 100 MG tablet Take 1 tablet (100 mg total) by mouth every 2 (two) hours as needed for migraine. May repeat in 2 hours if headache  persists or recurs. Patient not taking: Reported on 12/23/2016 04/12/16   Deatra Canterxford, William J, FNP  terbinafine (LAMISIL) 1 % cream Apply 1 application topically 2 (two) times daily. 06/04/18   LampteyBritta Mccreedy, Philip O, MD    Family History Family History  Problem Relation Age of Onset  . Diabetes Mother   . Hypertension Mother   . Stroke Father   . Diabetes Maternal Grandmother   . Hyperlipidemia Maternal Grandmother   . Diabetes Maternal Grandfather     Social History Social History   Tobacco Use  . Smoking status: Never Smoker  . Smokeless tobacco: Never Used  Substance Use Topics  . Alcohol use: Yes    Alcohol/week: 0.0 standard drinks    Comment: occ  . Drug use: No     Allergies   Penicillins   Review of Systems Review of Systems  Constitutional: Negative for activity change, appetite change, fatigue and fever.  HENT: Positive for ear pain. Negative for congestion, ear discharge, mouth sores, rhinorrhea, sinus pressure and sore throat.   Eyes: Negative for pain, discharge and itching.  Respiratory: Negative for chest tightness and shortness of breath.  Cardiovascular: Negative for chest pain and palpitations.  Gastrointestinal: Negative for abdominal distention, abdominal pain, diarrhea and nausea.  Genitourinary: Negative.   Musculoskeletal: Negative for arthralgias, gait problem and myalgias.  Skin: Positive for rash. Negative for color change and wound.  Neurological: Negative.      Physical Exam Triage Vital Signs ED Triage Vitals  Enc Vitals Group     BP 06/04/18 0915 114/78     Pulse Rate 06/04/18 0915 81     Resp 06/04/18 0915 18     Temp 06/04/18 0915 98.2 F (36.8 C)     Temp Source 06/04/18 0915 Oral     SpO2 06/04/18 0915 99 %     Weight --      Height --      Head Circumference --      Peak Flow --      Pain Score 06/04/18 0910 6     Pain Loc --      Pain Edu? --      Excl. in GC? --    No data found.  Updated Vital Signs BP 114/78 (BP  Location: Left Arm)   Pulse 81   Temp 98.2 F (36.8 C) (Oral)   Resp 18   LMP 05/23/2018   SpO2 99%   Visual Acuity Right Eye Distance:   Left Eye Distance:   Bilateral Distance:    Right Eye Near:   Left Eye Near:    Bilateral Near:     Physical Exam Vitals signs and nursing note reviewed.  Constitutional:      Appearance: She is well-developed.  HENT:     Right Ear: Tympanic membrane normal.     Left Ear: Tympanic membrane normal.     Mouth/Throat:     Mouth: Mucous membranes are moist. No oral lesions.     Pharynx: Uvula midline. No oropharyngeal exudate, posterior oropharyngeal erythema or uvula swelling.     Tonsils: No tonsillar exudate or tonsillar abscesses. 0 on the right. 0 on the left.  Eyes:     Conjunctiva/sclera: Conjunctivae normal.  Pulmonary:     Effort: Pulmonary effort is normal.     Breath sounds: Normal breath sounds.  Abdominal:     General: There is no distension.     Palpations: Abdomen is soft.     Tenderness: There is no abdominal tenderness. There is no rebound.  Skin:    General: Skin is warm.     Capillary Refill: Capillary refill takes less than 2 seconds.     Findings: Rash present.     Comments: Multiple lesions on right hand.  Circumferential rash with central clearing.  Mild palmar disquamation without significant erythema.  Neurological:     Mental Status: She is alert.      UC Treatments / Results  Labs (all labs ordered are listed, but only abnormal results are displayed) Labs Reviewed - No data to display  EKG None  Radiology No results found.  Procedures Procedures (including critical care time)  Medications Ordered in UC Medications - No data to display  Initial Impression / Assessment and Plan / UC Course  I have reviewed the triage vital signs and the nursing notes.  Pertinent labs & imaging results that were available during my care of the patient were reviewed by me and considered in my medical decision  making (see chart for details).     1.  Tenia Manuum:  Terbinafine cream 1% to apply twice daily for the next 4  weeks If no improvement after the next week, patient is advised to return for oral antifungal agents Hand hygiene  2.  Sore throat possibly secondary to viral infection Cepacol lozenges Salt water gargle Tylenol as needed Final Clinical Impressions(s) / UC Diagnoses   Final diagnoses:  Tinea manuum   Discharge Instructions   None    ED Prescriptions    Medication Sig Dispense Auth. Provider   terbinafine (LAMISIL) 1 % cream Apply 1 application topically 2 (two) times daily. 30 g Merrilee Jansky, MD     Controlled Substance Prescriptions [place] Controlled Substance Registry consulted? No   Merrilee Jansky, MD 06/04/18 [phone]

## 2018-06-04 NOTE — ED Triage Notes (Signed)
Patient also has questions about hands Patient has discolored areas on both hands E visit-dx with eczema and given a cream-not helping

## 2018-06-04 NOTE — ED Triage Notes (Signed)
Sore throat started on Monday 5/4.  Left side of throat hurts and left ear hurts.   Denies fever Denies cough
